# Patient Record
Sex: Female | Born: 1940 | Race: White | Hispanic: No | Marital: Single | Smoking: Former smoker
Health system: Southern US, Community
[De-identification: ages and names within clinical notes are randomized; demographics above are authoritative.]

## PROBLEM LIST (undated history)

## (undated) DIAGNOSIS — C50919 Malignant neoplasm of unspecified site of unspecified female breast: Secondary | ICD-10-CM

## (undated) DIAGNOSIS — R112 Nausea with vomiting, unspecified: Secondary | ICD-10-CM

## (undated) DIAGNOSIS — Z9889 Other specified postprocedural states: Secondary | ICD-10-CM

## (undated) DIAGNOSIS — I10 Essential (primary) hypertension: Secondary | ICD-10-CM

## (undated) DIAGNOSIS — E785 Hyperlipidemia, unspecified: Secondary | ICD-10-CM

## (undated) DIAGNOSIS — R42 Dizziness and giddiness: Secondary | ICD-10-CM

## (undated) DIAGNOSIS — T753XXA Motion sickness, initial encounter: Secondary | ICD-10-CM

## (undated) HISTORY — PX: BREAST CYST ASPIRATION: SHX578

## (undated) HISTORY — PX: PILONIDAL CYST / SINUS EXCISION: SUR543

## (undated) HISTORY — PX: ABDOMINAL HYSTERECTOMY: SHX81

## (undated) HISTORY — DX: Essential (primary) hypertension: I10

## (undated) HISTORY — DX: Dizziness and giddiness: R42

## (undated) HISTORY — DX: Hyperlipidemia, unspecified: E78.5

## (undated) HISTORY — PX: TOE SURGERY: SHX1073

## (undated) HISTORY — DX: Malignant neoplasm of unspecified site of unspecified female breast: C50.919

---

## 2008-12-03 ENCOUNTER — Ambulatory Visit: Payer: Self-pay | Admitting: Family Medicine

## 2010-04-23 ENCOUNTER — Ambulatory Visit: Payer: Self-pay | Admitting: Family Medicine

## 2011-08-03 ENCOUNTER — Ambulatory Visit: Payer: Self-pay | Admitting: Family Medicine

## 2012-09-11 ENCOUNTER — Emergency Department: Payer: Self-pay | Admitting: *Deleted

## 2012-09-11 LAB — COMPREHENSIVE METABOLIC PANEL
Alkaline Phosphatase: 72 U/L (ref 50–136)
Anion Gap: 9 (ref 7–16)
Calcium, Total: 9.7 mg/dL (ref 8.5–10.1)
Chloride: 104 mmol/L (ref 98–107)
Co2: 29 mmol/L (ref 21–32)
Creatinine: 0.8 mg/dL (ref 0.60–1.30)
Sodium: 142 mmol/L (ref 136–145)

## 2012-09-11 LAB — CBC
HCT: 41.5 % (ref 35.0–47.0)
MCHC: 34.3 g/dL (ref 32.0–36.0)
MCV: 92 fL (ref 80–100)
RDW: 14.1 % (ref 11.5–14.5)
WBC: 6.6 10*3/uL (ref 3.6–11.0)

## 2012-09-28 ENCOUNTER — Ambulatory Visit: Payer: Self-pay | Admitting: Family Medicine

## 2012-11-07 ENCOUNTER — Ambulatory Visit: Payer: Self-pay | Admitting: Family Medicine

## 2013-10-04 ENCOUNTER — Ambulatory Visit: Payer: Self-pay | Admitting: Family Medicine

## 2013-11-09 ENCOUNTER — Ambulatory Visit: Payer: Self-pay | Admitting: Family Medicine

## 2015-04-30 DIAGNOSIS — R42 Dizziness and giddiness: Secondary | ICD-10-CM | POA: Insufficient documentation

## 2015-04-30 DIAGNOSIS — H8190 Unspecified disorder of vestibular function, unspecified ear: Secondary | ICD-10-CM | POA: Insufficient documentation

## 2015-04-30 DIAGNOSIS — H832X9 Labyrinthine dysfunction, unspecified ear: Secondary | ICD-10-CM | POA: Insufficient documentation

## 2016-04-01 DIAGNOSIS — E785 Hyperlipidemia, unspecified: Secondary | ICD-10-CM | POA: Insufficient documentation

## 2016-04-19 ENCOUNTER — Other Ambulatory Visit: Payer: Self-pay | Admitting: Family Medicine

## 2016-04-19 DIAGNOSIS — Z780 Asymptomatic menopausal state: Secondary | ICD-10-CM

## 2016-04-19 DIAGNOSIS — Z1231 Encounter for screening mammogram for malignant neoplasm of breast: Secondary | ICD-10-CM

## 2016-05-03 ENCOUNTER — Ambulatory Visit
Admission: RE | Admit: 2016-05-03 | Discharge: 2016-05-03 | Disposition: A | Discharge status: Home / Self Care | Payer: Medicare Other | Source: Ambulatory Visit | Attending: Family Medicine | Admitting: Family Medicine

## 2016-05-03 DIAGNOSIS — M85832 Other specified disorders of bone density and structure, left forearm: Secondary | ICD-10-CM | POA: Insufficient documentation

## 2016-05-03 DIAGNOSIS — Z780 Asymptomatic menopausal state: Secondary | ICD-10-CM | POA: Diagnosis present

## 2016-05-03 DIAGNOSIS — Z1231 Encounter for screening mammogram for malignant neoplasm of breast: Secondary | ICD-10-CM | POA: Diagnosis not present

## 2016-05-05 ENCOUNTER — Other Ambulatory Visit: Payer: Self-pay | Admitting: Family Medicine

## 2016-05-05 DIAGNOSIS — R928 Other abnormal and inconclusive findings on diagnostic imaging of breast: Secondary | ICD-10-CM

## 2016-05-14 ENCOUNTER — Ambulatory Visit
Admission: RE | Admit: 2016-05-14 | Discharge: 2016-05-14 | Disposition: A | Discharge status: Home / Self Care | Payer: Medicare Other | Source: Ambulatory Visit | Attending: Family Medicine | Admitting: Family Medicine

## 2016-05-14 DIAGNOSIS — N63 Unspecified lump in breast: Secondary | ICD-10-CM | POA: Insufficient documentation

## 2016-05-14 DIAGNOSIS — R928 Other abnormal and inconclusive findings on diagnostic imaging of breast: Secondary | ICD-10-CM

## 2016-05-24 ENCOUNTER — Other Ambulatory Visit: Payer: Self-pay | Admitting: Family Medicine

## 2016-05-24 DIAGNOSIS — N63 Unspecified lump in unspecified breast: Secondary | ICD-10-CM

## 2016-05-25 ENCOUNTER — Other Ambulatory Visit: Payer: Self-pay | Admitting: Family Medicine

## 2016-05-25 DIAGNOSIS — N63 Unspecified lump in unspecified breast: Secondary | ICD-10-CM

## 2016-06-09 ENCOUNTER — Encounter: Payer: Self-pay | Admitting: Oncology

## 2016-06-09 ENCOUNTER — Ambulatory Visit
Admission: RE | Admit: 2016-06-09 | Discharge: 2016-06-09 | Disposition: A | Discharge status: Home / Self Care | Payer: Medicare Other | Source: Ambulatory Visit | Attending: Family Medicine | Admitting: Family Medicine

## 2016-06-09 DIAGNOSIS — N63 Unspecified lump in unspecified breast: Secondary | ICD-10-CM

## 2016-06-09 DIAGNOSIS — C50912 Malignant neoplasm of unspecified site of left female breast: Secondary | ICD-10-CM | POA: Insufficient documentation

## 2016-06-09 HISTORY — PX: BREAST BIOPSY: SHX20

## 2016-06-14 ENCOUNTER — Encounter: Payer: Self-pay | Admitting: *Deleted

## 2016-06-14 NOTE — Progress Notes (Signed)
  Oncology Nurse Navigator Documentation  Navigator Location: [facility] (06/14/16 1100) Navigator Encounter Type: Introductory phone call (06/14/16 1100)   Abnormal Finding Date: 05/14/16 (06/14/16 1100) Confirmed Diagnosis Date: 06/11/16 (06/14/16 1100)         Barriers/Navigation Needs: Coordination of Care;Education (06/14/16 1100) Education: Coping with Diagnosis/ Prognosis (06/14/16 1100) Interventions: Coordination of Care (06/14/16 1100)                      Time Spent with Patient: 45 (06/14/16 1100)   Called and talked with patient today to establish navigation services.  I have scheduled her to see Dr. Grayland Ormond on Friday, 06/18/16 at 8:30 at the [facility] office, and then Dr. Azalee Course for surgical consult on Friday at 12:45.  Will give patient educational literature at that time.  She is to call if she has any questions or needs.

## 2016-06-18 ENCOUNTER — Ambulatory Visit: Payer: 59 | Admitting: Oncology

## 2016-06-18 ENCOUNTER — Encounter: Payer: Self-pay | Admitting: *Deleted

## 2016-06-18 ENCOUNTER — Ambulatory Visit (INDEPENDENT_AMBULATORY_CARE_PROVIDER_SITE_OTHER): Payer: Medicare Other | Admitting: Surgery

## 2016-06-18 ENCOUNTER — Inpatient Hospital Stay: Payer: Medicare Other | Attending: Oncology | Admitting: Oncology

## 2016-06-18 ENCOUNTER — Other Ambulatory Visit: Payer: Self-pay | Admitting: Surgery

## 2016-06-18 ENCOUNTER — Telehealth: Payer: Self-pay

## 2016-06-18 ENCOUNTER — Other Ambulatory Visit: Payer: Self-pay

## 2016-06-18 ENCOUNTER — Encounter: Payer: Self-pay | Admitting: Oncology

## 2016-06-18 ENCOUNTER — Encounter: Payer: Self-pay | Admitting: Surgery

## 2016-06-18 VITALS — BP 110/72 | HR 60 | Temp 97.5°F | Resp 18 | Wt 176.6 lb

## 2016-06-18 VITALS — BP 138/83 | HR 62 | Temp 97.5°F | Ht 67.0 in | Wt 177.0 lb

## 2016-06-18 DIAGNOSIS — C50912 Malignant neoplasm of unspecified site of left female breast: Secondary | ICD-10-CM

## 2016-06-18 DIAGNOSIS — C50919 Malignant neoplasm of unspecified site of unspecified female breast: Secondary | ICD-10-CM

## 2016-06-18 DIAGNOSIS — C50512 Malignant neoplasm of lower-outer quadrant of left female breast: Secondary | ICD-10-CM | POA: Insufficient documentation

## 2016-06-18 DIAGNOSIS — Z170 Estrogen receptor positive status [ER+]: Secondary | ICD-10-CM | POA: Diagnosis not present

## 2016-06-18 DIAGNOSIS — Z809 Family history of malignant neoplasm, unspecified: Secondary | ICD-10-CM | POA: Diagnosis not present

## 2016-06-18 DIAGNOSIS — Z87891 Personal history of nicotine dependence: Secondary | ICD-10-CM | POA: Diagnosis not present

## 2016-06-18 NOTE — Progress Notes (Signed)
States is having soreness in left breast around biopsy site.

## 2016-06-18 NOTE — Telephone Encounter (Signed)
Patient notified of appointments listed below.  [facility]-06/28/16 @ 2pm  Dr.Loflin 06/30/16 @ 9:15 [facility] office.

## 2016-06-18 NOTE — Patient Instructions (Signed)
I will call you later today with your MRI appointment time and date and your follow up appointment with Dr.Loflin.

## 2016-06-18 NOTE — Progress Notes (Addendum)
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  INTERVAL HISTORY: Patient is a 75 year old female who had an abnormal yearly screening mammogram. Subsequent ultrasound and biopsy confirmed invasive adenocarcinoma. She currently has soreness in her left breast at her biopsy site, but otherwise feels well. She has no neurologic complaints. She denies any recent fevers or illnesses. She denies any chest pain or shortness of breath.  She has a good appetite and denies weight loss. She denies any nausea, vomiting, constipation, or diarrhea. She has no urinary complaints. Patient otherwise feels well and offers no further specific complaints.  REVIEW OF SYSTEMS:   Review of Systems  Constitutional: Negative.  Negative for fever, weight loss and malaise/fatigue.  Respiratory: Negative.  Negative for cough and shortness of breath.   Cardiovascular: Negative.  Negative for chest pain.  Gastrointestinal: Negative.  Negative for abdominal pain.  Genitourinary: Negative.   Musculoskeletal: Negative.   Neurological: Negative.  Negative for weakness.  Psychiatric/Behavioral: Negative.     As per HPI. Otherwise, a complete review of systems is negatve.  PAST MEDICAL HISTORY: Past Medical History  Diagnosis Date  . Breast cancer ([place])     PAST SURGICAL HISTORY: Past Surgical History  Procedure Laterality Date  . Breast cyst aspiration Left     neg  . Breast biopsy Left 06/09/2016    left [place] core bx  . Abdominal hysterectomy    . Toe surgery    . Pilonidal cyst / sinus excision      FAMILY HISTORY Family History  Problem Relation Age of Onset  . Cancer Mother        ADVANCED DIRECTIVES:      HEALTH MAINTENANCE: Social History  Substance Use Topics  . Smoking status: Former Research scientist (life sciences)  . Smokeless tobacco: Not on file  . Alcohol Use: Not on file     Colonoscopy:  PAP:  Bone density:  Lipid panel:  Not on File  Current Outpatient Prescriptions  Medication Sig Dispense Refill  . Multiple Vitamin (MULTIVITAMIN) capsule Take 1 capsule by mouth.    . triamterene-hydrochlorothiazide (DYAZIDE) 37.5-25 MG capsule Take 1 capsule by mouth.     No current facility-administered medications for this visit.    OBJECTIVE: Filed Vitals:   06/18/16 0903  BP: 110/72  Pulse: 60  Temp: 97.5 F (36.4 C)  Resp: 18     There is no height on file to calculate BMI.    ECOG FS:0 - Asymptomatic  General: Well-developed, well-nourished, no acute distress. Eyes: Pink conjunctiva, anicteric sclera. Breasts: Patient requested exam be deferred today. HEENT: Normocephalic, moist mucous membranes, clear oropharnyx. Lungs: Clear to auscultation bilaterally. Heart: Regular rate and rhythm. No rubs, murmurs, or gallops. Abdomen: Soft, nontender, nondistended. No organomegaly noted, normoactive bowel sounds. Musculoskeletal: No edema, cyanosis, or clubbing. Neuro: Alert, answering all questions appropriately. Cranial nerves grossly intact. Skin: No rashes or petechiae noted. Psych: Normal affect. Lymphatics: No cervical, calvicular, axillary or inguinal LAD.   LAB RESULTS:  Lab Results  Component Value Date   NA 142 09/11/2012   K 3.8 09/11/2012   CL 104 09/11/2012   CO2 29 09/11/2012   GLUCOSE 94 09/11/2012   BUN 27* 09/11/2012   CREATININE 0.80 09/11/2012   CALCIUM  9.7 09/11/2012   PROT 7.5 09/11/2012   ALBUMIN 4.4 09/11/2012   AST 19 09/11/2012   ALT 19 09/11/2012   ALKPHOS 72 09/11/2012   BILITOT 0.4 09/11/2012   GFRNONAA >60 09/11/2012   GFRAA >60 09/11/2012    Lab Results  Component Value Date   WBC 6.6 09/11/2012   HGB 14.2 09/11/2012   HCT 41.5 09/11/2012    MCV 92 09/11/2012   PLT 243 09/11/2012     STUDIES: Mm Digital Diagnostic Unilat L  06/09/2016  CLINICAL DATA:  Status post ultrasound-guided biopsy of a left breast mass earlier today. EXAM: DIAGNOSTIC LEFT MAMMOGRAM POST ULTRASOUND BIOPSY COMPARISON:  Previous exam(s). FINDINGS: Mammographic images were obtained following ultrasound guided biopsy of the left breast mass at the 3:30 o'clock axis, 8 cm from the nipple. At the conclusion of the procedure, a ribbon shaped tissue marker was placed at the biopsy site. This biopsy clip is well positioned within the targeted mass. IMPRESSION: Postprocedure mammogram for clip placement. Biopsy clip is well positioned within the targeted mass in the outer left breast. Final Assessment: Post Procedure Mammograms for Marker Placement Electronically Signed   By: Franki Cabot M.D.   On: 06/09/2016 09:17   [place] Lt Breast Bx W Loc Dev 1st Lesion Img Bx Spec US Guide  06/16/2016  ADDENDUM REPORT: 06/16/2016 08:53 ADDENDUM: Pathology of the left breast biopsy revealed INVASIVE MAMMARY CARCINOMA OF NO SPECIAL TYPE. TUMOR SIZE IN THIS CORE SAMPLE: 5 MM. PRELIMINARY GRADE: 1 (NOTTINGHAM HISTOLOGIC GRADE). Note: ER, PR and HER2-neu immunohistochemistry is obtained and results will be issued in an addendum. HER2 FISH will be performed for equivocal results. Final histologic grade should be based on the excised tumor. Results were called to Lorriane Shire of [place] on 06/11/16 at 9:50 AM. This was found to be concordant by Dr. Enriqueta Shutter. Recommendations:  Surgical referral. At the patient's request, pathology and recommendations were relayed to the patient by Dr. Theda Sers on 06/11/16. The patient stated she did well following the biopsy with no bleeding or hematoma. All of her questions were answered. She was encouraged to call the [facility] with any further questions or concerns. A surgical consultation will be made  for the patient by the nurse navigators at [facility]. The patient will be contacted by the navigators with the information. The nurse navigators were contacted by Jetta Lout, RRA with results and recommendations. An appointment was made for the patient to see Dr. Grayland Ormond (oncology) on 06/18/16 at 8:30 AM and Dr. Azalee Course (surgeon) for 06/18/16 at 12:45 PM by Tanya Nones, RN, nurse navigator. The patient has been notified of the appointments. Addendum by Jetta Lout, RRA on 06/16/16. Electronically Signed   By: Franki Cabot M.D.   On: 06/16/2016 08:53  06/16/2016  CLINICAL DATA:  Patient with a left breast mass presents today for ultrasound-guided biopsy. EXAM: ULTRASOUND GUIDED LEFT BREAST CORE NEEDLE BIOPSY COMPARISON:  Previous exam(s). PROCEDURE: I met with the patient and we discussed the procedure of ultrasound-guided biopsy, including benefits and alternatives. We discussed the high likelihood of a successful procedure. We discussed the risks of the procedure including infection, bleeding, tissue injury, clip migration, and inadequate sampling. Informed written consent was given. The usual time-out protocol was performed immediately prior to the procedure. Using sterile technique and 1% Lidocaine as local anesthetic, under direct ultrasound visualization, a 12 gauge spring-loaded device was used to perform biopsy of the complex cystic and  solid mass within the outer left breast, 3:30 o'clock axis region, 8 cm from the nipple,using a lateral approach. At the conclusion of the procedure, a ribbon shaped tissue marker clip was deployed into the biopsy cavity. Follow-up 2-view mammogram was performed and dictated separately. IMPRESSION: Ultrasound-guided biopsy of left breast mass at the 3:30 o'clock axis. No apparent complications. Electronically Signed: By: Franki Cabot M.D. On: 06/09/2016 09:19    ASSESSMENT: Clinical stage IA ER PR positive, HER-2 negative adenocarcinoma of the  lower outer quadrant of left breast.  PLAN:    1. 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 Patient has an appointment with surgery later this afternoon. Will arrange radiation oncology follow-up postoperatively. Will also order Oncotype testing on patient's tumor to assess whether chemotherapy is necessary or not. Patient will return to clinic 1 to 2 weeks postoperatively to discuss her Oncotype score and additional treatment planning. Also, at the conclusion of all her treatments she will require 5 years of an aromatase inhibitor given the ER/PR status of her tumor.  Approximately 45 minutes was spent in discussion of which greater than 50% was consultation.  Patient expressed understanding and was in agreement with this plan. She also understands that She can call clinic at any time with any questions, concerns, or complaints.     Lloyd Huger, MD   06/18/2016 4:43 PM

## 2016-06-18 NOTE — Progress Notes (Signed)
  Oncology Nurse Navigator Documentation  Navigator Location: [facility] (06/18/16 1000) Navigator Encounter Type: Initial MedOnc (06/18/16 1000)           Patient Visit Type: MedOnc (06/18/16 1000) Treatment Phase: Pre-Tx/Tx Discussion (06/18/16 1000) Barriers/Navigation Needs: Education (06/18/16 1000) Education: Newly Diagnosed Cancer Education;Understanding Cancer/ Treatment Options (06/18/16 1000) Interventions: Education Method (06/18/16 1000)     Education Method: Written (06/18/16 1000)      Acuity: Level 2 (06/18/16 1000)         Time Spent with Patient: 90 (06/18/16 1000)   Met patient today during her initial medical oncology visit with Dr. Grayland Ormond.  Patient is newly diagnosed with stage I invasive breast cancer.  Gave patient breast cancer educational literature, "My Breast Cancer Treatment Handbook" by Josephine Igo, RN.   Patient has an appointment today with Dr. Azalee Course.  She is to call me with her surgery date and I will schedule a follow-up appointment with Dr. Grayland Ormond 1-2 weeks after surgery.  She is to call if she has any questions or needs.

## 2016-06-21 ENCOUNTER — Encounter: Payer: Self-pay | Admitting: Surgery

## 2016-06-21 NOTE — Progress Notes (Signed)
Subjective:     Patient ID: Melanie Simmons, female   DOB: 12-25-1940, 75 y.o.   MRN: [id number]  HPI  75 yr old female who comes in with new diagnosis of left breast cancer, invasive mammory carcinoma, ER + and PR+.  Patient states that this was seen on her normal screening mammogram in she was called back for biopsy. Patient states that she does still have some soreness and bruising at the biopsy site but otherwise no changes. 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 Patient states that she has not noticed any other changes to her breast skin or lesions or any nipple discharge. Patient started menarche at the age of 56 and she had a hysterectomy in 1986 with removal of bilateral ovaries. She did undergo some hormone replacement therapy for about 20 years but then took herself off of them.  She had her first child at the age of 56 and she did use birth control for about 2 years prior to having her hysterectomy. She does not have any family history of breast cancer but her mother did have liver cancer.She otherwise has not had any further symptoms no fever chills malaise weight loss nausea vomiting abdominal pain diarrhea constipation or dysuria.    Past Medical History  Diagnosis Date  . Breast cancer ([place])   . Hyperlipidemia   . Vertigo    Past Surgical History  Procedure Laterality Date  . Abdominal hysterectomy    . Toe surgery    . Pilonidal cyst / sinus excision    . Breast cyst aspiration Left     neg  . Breast biopsy Left 06/09/2016    left [place] core bx   Family History  Problem Relation Age of Onset  . Cancer Mother    Social History   Social History  . Marital Status: Single    Spouse Name: N/A  . Number of Children: N/A  . Years of Education: N/A   Social History Main Topics  . Smoking status: Former Research scientist (life sciences)  . Smokeless tobacco: Never Used  . Alcohol Use: 8.4 oz/week     0 Standard drinks or equivalent, 14 Shots of liquor per week  . Drug Use: No  . Sexual Activity: Yes    Birth Control/ Protection: Post-menopausal, Surgical   Other Topics Concern  . None   Social History Narrative    Current outpatient prescriptions:  Marland Kitchen  Multiple Vitamin (MULTIVITAMIN) capsule, Take 1 capsule by mouth., Disp: , Rfl:  .  triamterene-hydrochlorothiazide (DYAZIDE) 37.5-25 MG capsule, Take 1 capsule by mouth., Disp: , Rfl:  No Known Allergies   Review of Systems  Constitutional: Negative for fever, chills, activity change, appetite change, fatigue and unexpected weight change.  HENT: Negative for congestion, sneezing and sore throat.   Respiratory: Negative for cough, choking, chest tightness, shortness of breath and wheezing.   Cardiovascular: Negative for chest pain, palpitations and leg swelling.  Gastrointestinal: Negative for vomiting, abdominal pain, diarrhea, constipation, abdominal distention and anal bleeding.  Genitourinary: Negative for dysuria, hematuria and difficulty urinating.  Musculoskeletal: Negative for back pain, arthralgias and neck pain.  Skin: Negative for color change, pallor, rash and wound.  Neurological: Positive for dizziness. Negative for tremors and syncope.  Psychiatric/Behavioral: Negative for agitation. The patient is not nervous/anxious.   All other systems reviewed and are negative.      Filed Vitals:   06/18/16  1303  BP: 138/83  Pulse: 62  Temp: 97.5 F (36.4 C)    Objective:   Physical Exam  Constitutional: She is oriented to person, place, and time. She appears well-developed and well-nourished. No distress.  HENT:  Head: Normocephalic and atraumatic.  Right Ear: External ear normal.  Left Ear: External ear normal.  Nose: Nose normal.  Mouth/Throat: Oropharynx is clear and moist. No oropharyngeal exudate.  Eyes: Conjunctivae and EOM are normal. Pupils are equal, round, and reactive to light. No scleral icterus.   Neck: Normal range of motion. Neck supple. No tracheal deviation present.  Cardiovascular: Normal rate, regular rhythm and intact distal pulses.   Pulmonary/Chest: Effort normal. No respiratory distress. She has no wheezes. She exhibits no tenderness.  Abdominal: Soft. Bowel sounds are normal. She exhibits no distension. There is no tenderness. There is no guarding.  Musculoskeletal: Normal range of motion. She exhibits no edema or tenderness.  Neurological: She is alert and oriented to person, place, and time. No cranial nerve deficit.  Skin: Skin is warm and dry. No rash noted. No erythema. No pallor.  Psychiatric: She has a normal mood and affect. Her behavior is normal. Judgment and thought content normal.  Vitals reviewed.  Imaging: Targeted ultrasound of the left breast was performed demonstrating a cyst with an internal solid component/mass demonstrating increased vascularity at 3 o'clock 8 cm from nipple measuring 1.1 x 1 x 1.2 cm. This mass corresponds with mammography findings. No lymphadenopathy seen in the left axilla.  IMPRESSION: Suspicious left breast mass/intracystic mass.  RECOMMENDATION: Ultrasound-guided biopsy of the mass in the outer left breast is recommended. This will be scheduled for the patient.  I have discussed the findings and recommendations with the patient. Results were also provided in writing at the conclusion of the visit. If applicable, a reminder letter will be sent to the patient regarding the next appointment.  BI-RADS CATEGORY 4: Suspicious.   Pathology:  LEFT BREAST, 3:30, 8 CMFN; BIOPSY:  - INVASIVE MAMMARY CARCINOMA OF NO SPECIAL TYPE.  - TUMOR SIZE IN THIS CORE SAMPLE: 5 MM.  - PRELIMINARY GRADE: 1 (NOTTINGHAM HISTOLOGIC GRADE)    TUBULAR/GLANDULAR DIFFERENTIATION SCORE: 2    NUCLEAR PLEOMORPHISM SCORE: 2    MITOTIC RATE SCORE: 1  BREAST BIOMARKER TESTS  Test(s) Performed: Estrogen Receptor (ER)  Results: POSITIVE   Range: >90%  Test Type:   FDA cleared ([place])  Primary Antibody:  SP1  Progesterone Receptor (PgR)  Results: POSITIVE  Range: >90%  Test Type:   FDA cleared ([place])  Primary Antibody:  1E2  HER2 (ERBB2) by Immunohistochemistry (IHC)  Results: Negative (Score 1+)     Assessment:     75yrold female with left breast cancer    Plan:     I personally reviewed the patient's past medical history well-controlled hyperlipidemia but otherwise some vertigo, also [facility] in [facility] at [facility].  I also personally reviewed the patient's laboratory values and pathology as above noting invasive mammary carcinoma, ER/PR+, Her2 negative.   I personally reviewed the radiology images as well as the read that as above. And on my review the area in question seemed larger then the 1 cm area that they were noting in this 3:00 position. There does appear to be some area of scar tissue in the area but the are is about 3 cm on my measurement however not very clearly  defined on either the ultrasound or the mammographic images. This could be distorted by previous scar tissue from cyst aspiration in 2013 or could represent that there is a potential larger area of either cancerous or DCIS material surrounding the biopsy area. I discussed this with the patient and showed her the images as well. I recommended that she have a MRI of the breasts for [place] to further evaluate the size of this area. I discussed with the patient the options of lumpectomy versus mastectomy but did let her know if this area is 3 cm in size would be too large given the relative size of her breast to do a lumpectomy and then would be recommending mastectomy.    I discussed the available options with the patient and daughter. The risk of recurrence is similar between mastectomy and lumpectomy with radiation. I also discussed that we  would need to do a sentinel lymph node biopsy to check the nodes. Explained to the patient that after her surgical treatment she would also need an aromatase inhibitor since her tumor was ER/PR +.   I discussed risks of bleeding, infection, damage to surrounding tissues, having positive margins, needing further resection, damage to nerves causing arm numbness or difficulty raising arm, causing lymphoedema in the arm; as well as anesthesia risks of MI, stroke, prolonged ventilation, pulmonary embolism, thrombosis and even death. Given that we still need to get an MRI to determine which surgical options is best for the patient, we deferred breast exam until next visit.  I will order MRI for next week and see her as soon as possible after that to determine best surgical intervention.  Patient was given the opportunity to ask questions and have them answered.  She is agreement with plan as stated.

## 2016-06-28 ENCOUNTER — Ambulatory Visit
Admission: RE | Admit: 2016-06-28 | Discharge: 2016-06-28 | Disposition: A | Discharge status: Home / Self Care | Payer: Medicare Other | Source: Ambulatory Visit | Attending: Surgery | Admitting: Surgery

## 2016-06-28 DIAGNOSIS — C50919 Malignant neoplasm of unspecified site of unspecified female breast: Secondary | ICD-10-CM

## 2016-06-28 MED ORDER — GADOBENATE DIMEGLUMINE 529 MG/ML IV SOLN
14.0000 mL | Freq: Once | INTRAVENOUS | Status: AC | PRN
  Administered 2016-06-28: 14 mL via INTRAVENOUS

## 2016-06-30 ENCOUNTER — Encounter: Payer: Self-pay | Admitting: Surgery

## 2016-06-30 ENCOUNTER — Ambulatory Visit: Payer: Self-pay | Admitting: Surgery

## 2016-06-30 ENCOUNTER — Ambulatory Visit (INDEPENDENT_AMBULATORY_CARE_PROVIDER_SITE_OTHER): Payer: Medicare Other | Admitting: Surgery

## 2016-06-30 VITALS — BP 153/84 | HR 65 | Temp 98.0°F | Ht 67.0 in | Wt 178.4 lb

## 2016-06-30 DIAGNOSIS — C50212 Malignant neoplasm of upper-inner quadrant of left female breast: Secondary | ICD-10-CM

## 2016-06-30 NOTE — Patient Instructions (Signed)
We have spoken to you today about 2 options to remove your Breast Cancer. Take some time to think about this decision. Call me when you are ready to schedule your surgery and I will be glad to answer and any questions that you have and assist you in scheduling your surgery.  Lumpectomy A lumpectomy is a form of "breast conserving" or "breast preservation" surgery. It may also be referred to as a partial mastectomy. During a lumpectomy, the portion of the breast that contains the cancerous tumor or breast mass (the lump) is removed. Some normal tissue around the lump may also be removed to make sure all of the tumor has been removed.  LET [facility] CARE PROVIDER KNOW ABOUT:  Any allergies you have.  All medicines you are taking, including vitamins, herbs, eye drops, creams, and over-the-counter medicines.  Previous problems you or members of your family have had with the use of anesthetics.  Any blood disorders you have.  Previous surgeries you have had.  Medical conditions you have. RISKS AND COMPLICATIONS Generally, this is a safe procedure. However, problems can occur and include:  Bleeding.  Infection.  Pain.  Temporary swelling.  Change in the shape of the breast, particularly if a large portion is removed. BEFORE THE PROCEDURE  Ask your health care provider about changing or stopping your regular medicines. This is especially important if you are taking diabetes medicines or blood thinners.  Do not eat or drink anything after midnight on the night before the procedure or as directed by your health care provider. Ask your health care provider if you can take a sip of water with any approved medicines.  On the day of surgery, your health care provider will use a mammogram or ultrasound to locate and mark the tumor in your breast. These markings on your breast will show where the cut (incision) will be made. PROCEDURE   An IV tube will be put into one of your veins.  You  may be given medicine to help you relax before the surgery (sedative). You will be given one of the following:  A medicine that numbs the area (local anesthetic).  A medicine that makes you fall asleep (general anesthetic).  Your health care provider will use a kind of electric scalpel that uses heat to minimize bleeding (electrocautery knife).  A curved incision (like a smile or frown) that follows the natural curve of your breast is made, to allow for minimal scarring and better healing.  The tumor will be removed with some of the surrounding tissue. This will be sent to the lab for analysis. Your health care provider may also remove your lymph nodes at this time if needed.  Sometimes, but not always, a rubber tube called a drain will be surgically inserted into your breast area or armpit to collect excess fluid that may accumulate in the space where the tumor was. This drain is connected to a plastic bulb on the outside of your body. This drain creates suction to help remove the fluid.  The incisions will be closed with stitches (sutures).  A bandage may be placed over the incisions. AFTER THE PROCEDURE  You will be taken to the recovery area.  You will be given medicine for pain.  A small rubber drain may be placed in the breast for 2-3 days to prevent a collection of blood (hematoma) from developing in the breast. You will be given instructions on caring for the drain before you go home.  A pressure bandage (dressing) will be applied for 1-2 days to prevent bleeding. Ask your health care provider how to care for your bandage at home.   This information is not intended to replace advice given to you by your health care provider. Make sure you discuss any questions you have with your health care provider.   Document Released: 01/17/2007 Document Revised: 12/27/2014 Document Reviewed: 05/11/2013 Elsevier Interactive Patient Education 2016 [place].   Total Mastectomy A total  mastectomy and a modified radical mastectomy are types of surgery for breast cancer. If you are having a total mastectomy (simple mastectomy), your entire breast will be removed. If you are having a modified radical mastectomy, your breast and nipple will be removed along with the lymph nodes under your arm. You may also have some of the lining over the muscle tissues under your breast removed. LET [facility] CARE PROVIDER KNOW ABOUT:  Any allergies you have.  All medicines you are taking, including vitamins, herbs, eye drops, creams, and over-the-counter medicines.  Previous problems you or members of your family have had with the use of anesthetics.  Any blood disorders you have.  Previous surgeries you have had.  Medical conditions you have. RISKS AND COMPLICATIONS Generally, this is a safe procedure. However, problems may occur, including:  Pain.  Infection.  Bleeding.  Scar tissue.  Chest numbness on the side of the surgery.  Fluid buildup under the skin flaps where your breast was removed (seroma).  Sensation of throbbing or tingling.  Stress or sadness from losing your breast. If you have the lymph nodes under your arm removed, you may have arm swelling, weakness, or numbness on the same side of your body as your surgery. BEFORE THE PROCEDURE  Ask your health care provider about:  Changing or stopping your regular medicines. This is especially important if you are taking diabetes medicines or blood thinners.  Taking medicines such as aspirin and ibuprofen. These medicines can thin your blood. Do not take these medicines before your procedure if your health care provider instructs you not to.  Follow your health care provider's instructions about eating or drinking restrictions.  Plan to have someone take you home after the procedure. PROCEDURE  An IV tube will be inserted into one of your veins.  You will be given a medicine that makes you fall asleep (general  anesthetic).  Your breast will be cleaned with a germ-killing solution (antiseptic).  A wide incision will be made around your nipple. The skin and nipple inside the incision will be removed along with all breast tissue.  If you are having a modified radical mastectomy:  The lining over your chest muscles will be removed.  The incision may be extended to reach the lymph nodes under your arm, or a second incision may be made.  The lymph nodes will be removed.  You may have a drainage tube inserted into your incision to collect fluid that builds up after surgery. This tube is connected to a suction bulb.  Your incision or incisions will be closed with stitches (sutures).  A bandage (dressing) will be placed over your breast and under your arm. The procedure may vary among health care providers and hospitals. AFTER THE PROCEDURE  You will be moved to a recovery area.  Your blood pressure, heart rate, breathing rate, and blood oxygen level will be monitored often until the medicines you were given have worn off.  You will be given pain medicine as needed.  After a while, you will be taken to a hospital room.  You will be encouraged to get up and walk as soon as you can.  Your IV tube can be removed when you are able to eat and drink.  Your drain may be removed before you go home from the hospital, or you may be sent home with your drain and suction bulb.   This information is not intended to replace advice given to you by your health care provider. Make sure you discuss any questions you have with your health care provider.   Document Released: 08/31/2001 Document Revised: 12/27/2014 Document Reviewed: 08/21/2014 Elsevier Interactive Patient Education [employer].

## 2016-07-01 ENCOUNTER — Encounter: Payer: Self-pay | Admitting: Surgery

## 2016-07-01 NOTE — Progress Notes (Addendum)
Subjective:     Patient ID: Melanie Simmons, female   DOB: Mar 22, 1941, 75 y.o.   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  Had her sent for a MRI of the breast to determine the size of suspicious area. Also discussed the patient in breast cancer conference with the multidisciplinary team.  Patient has not noted any other changes. Patient did bring her daughter in today and wanting to know more information about the 2 separate operations that could be performed. I did inform her that the size of the area on the MRI was about 2.5 cm and would make her eligible for a lumpectomy but would require radiation for 6 weeks afterwards. The patient is unsure and is leaning more toward mastectomy at this time but would like more time to make a decision. Past Medical History  Diagnosis Date  . Breast cancer ([place])   . Hyperlipidemia   . Vertigo    Past Surgical History  Procedure Laterality Date  . Abdominal hysterectomy    . Toe surgery    . Pilonidal cyst / sinus excision    . Breast cyst aspiration Left     neg  . Breast biopsy Left 06/09/2016    left [place] core bx   Family History  Problem Relation Age of Onset  . Cancer Mother 47    Liver   Social History   Social History  . Marital Status: Single    Spouse Name: N/A  . Number of Children: N/A  . Years of Education: N/A   Social History Main Topics  . Smoking status: Never Smoker   . Smokeless tobacco: Never Used  . Alcohol Use: 0.0 oz/week    0 Standard drinks or equivalent per week     Comment: 1 Scotch Daily  . Drug Use: No  . Sexual Activity: Yes    Birth Control/ Protection: Post-menopausal, Surgical   Other Topics Concern  . None   Social History Narrative    Current outpatient prescriptions:  Marland Kitchen  Multiple Vitamin (MULTIVITAMIN) capsule, Take 1 capsule by mouth., Disp: , Rfl:  .  triamterene-hydrochlorothiazide (DYAZIDE) 37.5-25 MG capsule, Take 1 capsule by mouth., Disp: ,  Rfl:  No Known Allergies    Review of Systems  Constitutional: Negative for fever, chills, appetite change, fatigue and unexpected weight change.  HENT: Negative for congestion, sinus pressure and sore throat.   Respiratory: Negative for cough, choking, shortness of breath, wheezing and stridor.   Cardiovascular: Negative for chest pain, palpitations and leg swelling.  Gastrointestinal: Negative for nausea, vomiting, abdominal pain, blood in stool, abdominal distention and anal bleeding.  Genitourinary: Negative for dysuria and urgency.  Musculoskeletal: Negative for back pain and neck pain.  Skin: Negative for color change, pallor, rash and wound.  Neurological: Negative for dizziness and weakness.  Hematological: Negative for adenopathy. Does not bruise/bleed easily.  Psychiatric/Behavioral: Negative for agitation. The patient is not nervous/anxious.   All other systems reviewed and are negative.      Filed Vitals:   06/30/16 0928  BP: 153/84  Pulse: 65  Temp: 98 F (36.7 C)    Objective:   Physical Exam  Constitutional: She is oriented to person, place, and time. She appears well-developed and well-nourished. No distress.  HENT:  Head: Normocephalic and atraumatic.  Right Ear: External ear normal.  Left Ear: External ear normal.  Nose: Nose normal.  Mouth/Throat: Oropharynx is clear and moist. No oropharyngeal  exudate.  Eyes: Conjunctivae and EOM are normal. Pupils are equal, round, and reactive to light. No scleral icterus.  Neck: Normal range of motion. Neck supple. No tracheal deviation present.  Cardiovascular: Normal rate, regular rhythm, normal heart sounds and intact distal pulses.  Exam reveals no gallop and no friction rub.   No murmur heard. Pulmonary/Chest: Effort normal and breath sounds normal. No respiratory distress. She has no wheezes. She has no rales.  Abdominal: Soft. Bowel sounds are normal. She exhibits no distension. There is no tenderness. There is  no rebound.  Musculoskeletal: Normal range of motion. She exhibits no edema or tenderness.  Neurological: She is alert and oriented to person, place, and time. No cranial nerve deficit.  Skin: Skin is warm and dry. No rash noted. No erythema. No pallor.  Psychiatric: She has a normal mood and affect. Her behavior is normal. Judgment and thought content normal.  Vitals reviewed. Breast Exam:  Right breast: no skin changes, nipple discharge or retraction, no masses, no lymphadenophthy Left breast: mild bruising from lateral biopsy site, no other skin changes, nipple discharge or retraction, no palpable mass, no axillary or supraclavicular lymphadenopathy  MRI:  IMPRESSION: Solitary 1.0 cm biopsy-proven malignancy in the lower outer quadrant of the left breast, posterior third. Negative for lymphadenopathy. No MRI evidence of malignancy in the right breast I also spoke with the radiologist on the phone and they measured the area in question at 2.5cm x 2.0cm     Assessment:     75 yr old female with left breast cancer ER/PR positive     Plan:     I discussed the available options with the patient and daughter. The risk of recurrence is similar between mastectomy and lumpectomy with radiation. I also discussed that we would need to do a sentinel lymph node biopsy to check the nodes with either procedure.I explained to the patient that after her surgical treatment she would also need an aromatase inhibitor since her tumor was ER/PR +.   I discussed risks of bleeding, infection, damage to surrounding tissues, having positive margins, needing further resection, damage to nerves causing arm numbness or difficulty raising arm, causing lymphoedema in the arm; as well as anesthesia risks of MI, stroke, prolonged ventilation, pulmonary embolism, thrombosis and even death. I also discussed that if a mastectomy was the choice shw would likely have drains in after the surgery and need to stay at least  one night in the hospital for pain control.  Patient and daughter were given the opportunity to ask questions and have them answered. She Is requesting more time to decide which procedure she would like to like to discuss it with her family. Patient is to call and let us know what she has decided and we will schedule her surgery at that time.

## 2016-07-08 ENCOUNTER — Encounter
Admission: RE | Admit: 2016-07-08 | Discharge: 2016-07-08 | Disposition: A | Discharge status: Home / Self Care | Payer: Medicare Other | Source: Ambulatory Visit | Attending: Surgery | Admitting: Surgery

## 2016-07-08 ENCOUNTER — Ambulatory Visit
Admission: RE | Admit: 2016-07-08 | Discharge: 2016-07-08 | Disposition: A | Discharge status: Home / Self Care | Payer: Medicare Other | Source: Ambulatory Visit | Attending: Surgery | Admitting: Surgery

## 2016-07-08 ENCOUNTER — Telehealth: Payer: Self-pay | Admitting: Surgery

## 2016-07-08 ENCOUNTER — Other Ambulatory Visit: Payer: Self-pay

## 2016-07-08 DIAGNOSIS — Z01810 Encounter for preprocedural cardiovascular examination: Secondary | ICD-10-CM | POA: Insufficient documentation

## 2016-07-08 DIAGNOSIS — Z01811 Encounter for preprocedural respiratory examination: Secondary | ICD-10-CM

## 2016-07-08 DIAGNOSIS — C50912 Malignant neoplasm of unspecified site of left female breast: Secondary | ICD-10-CM

## 2016-07-08 DIAGNOSIS — C50919 Malignant neoplasm of unspecified site of unspecified female breast: Secondary | ICD-10-CM | POA: Diagnosis present

## 2016-07-08 HISTORY — DX: Nausea with vomiting, unspecified: R11.2

## 2016-07-08 HISTORY — DX: Other specified postprocedural states: Z98.890

## 2016-07-08 LAB — CBC WITH DIFFERENTIAL/PLATELET
BASOS ABS: 0.1 10*3/uL (ref 0–0.1)
Basophils Relative: 1 %
EOS ABS: 0.1 10*3/uL (ref 0–0.7)
EOS PCT: 2 %
HCT: 41.1 % (ref 35.0–47.0)
Hemoglobin: 14.3 g/dL (ref 12.0–16.0)
LYMPHS PCT: 22 %
Lymphs Abs: 1.4 10*3/uL (ref 1.0–3.6)
MCH: 31.6 pg (ref 26.0–34.0)
MCHC: 34.7 g/dL (ref 32.0–36.0)
MCV: 91.1 fL (ref 80.0–100.0)
MONO ABS: 0.6 10*3/uL (ref 0.2–0.9)
Monocytes Relative: 9 %
Neutro Abs: 4.3 10*3/uL (ref 1.4–6.5)
Neutrophils Relative %: 66 %
PLATELETS: 249 10*3/uL (ref 150–440)
RBC: 4.52 MIL/uL (ref 3.80–5.20)
RDW: 13.5 % (ref 11.5–14.5)
WBC: 6.4 10*3/uL (ref 3.6–11.0)

## 2016-07-08 LAB — BASIC METABOLIC PANEL
ANION GAP: 7 (ref 5–15)
BUN: 32 mg/dL — AB (ref 6–20)
CALCIUM: 10.2 mg/dL (ref 8.9–10.3)
CO2: 33 mmol/L — ABNORMAL HIGH (ref 22–32)
CREATININE: 0.83 mg/dL (ref 0.44–1.00)
Chloride: 100 mmol/L — ABNORMAL LOW (ref 101–111)
GFR calc Af Amer: >60 mL/min (ref >60)
GLUCOSE: 100 mg/dL — AB (ref 65–99)
Potassium: 3.6 mmol/L (ref 3.5–5.1)
Sodium: 140 mmol/L (ref 135–145)

## 2016-07-08 LAB — PROTIME-INR
INR: 1.04
PROTHROMBIN TIME: 13.8 s (ref 11.4–15.0)

## 2016-07-08 LAB — APTT: APTT: 27 s (ref 24–36)

## 2016-07-08 NOTE — Pre-Procedure Instructions (Signed)
Reviewed EKG with Dr Rosey Bath, no further orders.

## 2016-07-08 NOTE — Patient Instructions (Signed)
Your procedure is scheduled on: Monday 07/12/16 Report to RADIOLOGY AT 8 AM AS DIRECTED   Remember: Instructions that are not followed completely may result in serious medical risk, up to and including death, or upon the discretion of your surgeon and anesthesiologist your surgery may need to be rescheduled.    __X__ 1. Do not eat food or drink liquids after midnight. No gum chewing or hard candies.     __X__ 2. No Alcohol for 24 hours before or after surgery.   ____ 3. Bring all medications with you on the day of surgery if instructed.    __X__ 4. Notify your doctor if there is any change in your medical condition     (cold, fever, infections).     Do not wear jewelry, make-up, hairpins, clips or nail polish.  Do not wear lotions, powders, or perfumes.   Do not shave 48 hours prior to surgery. Men may shave face and neck.  Do not bring valuables to the hospital.    [facility] is not responsible for any belongings or valuables.               Contacts, dentures or bridgework may not be worn into surgery.  Leave your suitcase in the car. After surgery it may be brought to your room.  For patients admitted to the hospital, discharge time is determined by your                treatment team.   Patients discharged the day of surgery will not be allowed to drive home.   Please read over the following fact sheets that you were given:   Surgical Site Infection Prevention   ____ Take these medicines the morning of surgery with A SIP OF WATER:    1. NONE  2.   3.   4.  5.  6.  ____ Fleet Enema (as directed)   __X__ Use CHG Soap as directed  ____ Use inhalers on the day of surgery  ____ Stop metformin 2 days prior to surgery    ____ Take 1/2 of usual insulin dose the night before surgery and none on the morning of surgery.   ____ Stop Coumadin/Plavix/aspirin on   ____ Stop Anti-inflammatories on    ____ Stop supplements until after surgery.    ____ Bring C-Pap to the  hospital.

## 2016-07-08 NOTE — Telephone Encounter (Signed)
Pt advised of pre op date/time and sx date. Sx: 07/12/16 with Dr Carole Binning Total Mastectomy with SN injection.  Pre op: 07/08/16 @ 1:00 pm--office.   Patient advised to arrive at registration in the [place] @ [facility] at 8:00 am the day of surgery so that she can go to radiology first for SN injection. Patient verbalized understanding of dates and times given.

## 2016-07-12 ENCOUNTER — Ambulatory Visit
Admission: RE | Admit: 2016-07-12 | Discharge: 2016-07-12 | Disposition: A | Discharge status: Home / Self Care | Payer: Medicare Other | Source: Ambulatory Visit | Attending: Surgery | Admitting: Surgery

## 2016-07-12 ENCOUNTER — Telehealth: Payer: Self-pay | Admitting: Surgery

## 2016-07-12 DIAGNOSIS — C50912 Malignant neoplasm of unspecified site of left female breast: Secondary | ICD-10-CM

## 2016-07-12 MED ORDER — CEFAZOLIN SODIUM-DEXTROSE 2-4 GM/100ML-% IV SOLN: 2.0000 g | INTRAVENOUS | Status: AC

## 2016-07-12 MED ORDER — CHLORHEXIDINE GLUCONATE CLOTH 2 % EX PADS: 6.0000 | MEDICATED_PAD | Freq: Once | CUTANEOUS | Status: DC

## 2016-07-12 MED ORDER — LACTATED RINGERS IV SOLN
INTRAVENOUS | Status: DC
  Administered 2016-07-16: 09:00:00 via INTRAVENOUS

## 2016-07-12 MED ORDER — FAMOTIDINE 20 MG PO TABS
20.0000 mg | ORAL_TABLET | Freq: Once | ORAL | Status: AC
  Administered 2016-07-16: 20 mg via ORAL

## 2016-07-12 NOTE — Telephone Encounter (Signed)
Patient has been advised that her surgery was reschedule from 7/24 to 7/28 due to the Humidity Levels within the hospital.   Pt advised of pre op date/time and sx date. Sx: 07/16/16 with Dr Carole Binning total mastectomy with SN injection.    Patient advised to arrive at 8:00am on the day of surgery at the [facility]Registration and will then be sent to [place] for SN injection.

## 2016-07-13 LAB — SURGICAL PATHOLOGY

## 2016-07-15 ENCOUNTER — Other Ambulatory Visit: Payer: Self-pay

## 2016-07-15 DIAGNOSIS — C50212 Malignant neoplasm of upper-inner quadrant of left female breast: Secondary | ICD-10-CM

## 2016-07-16 ENCOUNTER — Ambulatory Visit: Payer: Medicare Other | Admitting: Certified Registered"

## 2016-07-16 ENCOUNTER — Observation Stay
Admission: RE | Admit: 2016-07-16 | Discharge: 2016-07-17 | Disposition: A | Discharge status: Home / Self Care | Payer: Medicare Other | Source: Ambulatory Visit | Attending: Surgery | Admitting: Surgery

## 2016-07-16 ENCOUNTER — Ambulatory Visit
Admission: RE | Admit: 2016-07-16 | Discharge: 2016-07-16 | Disposition: A | Discharge status: Home / Self Care | Payer: Medicare Other | Source: Ambulatory Visit | Attending: Surgery | Admitting: Surgery

## 2016-07-16 ENCOUNTER — Encounter
Admission: RE | Disposition: A | Discharge status: Home / Self Care | Payer: Self-pay | Source: Ambulatory Visit | Attending: Surgery

## 2016-07-16 DIAGNOSIS — C50412 Malignant neoplasm of upper-outer quadrant of left female breast: Principal | ICD-10-CM | POA: Insufficient documentation

## 2016-07-16 DIAGNOSIS — Z79899 Other long term (current) drug therapy: Secondary | ICD-10-CM | POA: Diagnosis not present

## 2016-07-16 DIAGNOSIS — R42 Dizziness and giddiness: Secondary | ICD-10-CM | POA: Diagnosis not present

## 2016-07-16 DIAGNOSIS — E785 Hyperlipidemia, unspecified: Secondary | ICD-10-CM | POA: Diagnosis not present

## 2016-07-16 DIAGNOSIS — Z800 Family history of malignant neoplasm of digestive organs: Secondary | ICD-10-CM | POA: Insufficient documentation

## 2016-07-16 DIAGNOSIS — Z90710 Acquired absence of both cervix and uterus: Secondary | ICD-10-CM | POA: Insufficient documentation

## 2016-07-16 DIAGNOSIS — Z87891 Personal history of nicotine dependence: Secondary | ICD-10-CM | POA: Diagnosis not present

## 2016-07-16 DIAGNOSIS — C50512 Malignant neoplasm of lower-outer quadrant of left female breast: Secondary | ICD-10-CM | POA: Diagnosis not present

## 2016-07-16 DIAGNOSIS — Z170 Estrogen receptor positive status [ER+]: Secondary | ICD-10-CM | POA: Diagnosis not present

## 2016-07-16 DIAGNOSIS — C50912 Malignant neoplasm of unspecified site of left female breast: Secondary | ICD-10-CM

## 2016-07-16 HISTORY — PX: MASTECTOMY W/ SENTINEL NODE BIOPSY: SHX2001

## 2016-07-16 SURGERY — MASTECTOMY WITH SENTINEL LYMPH NODE BIOPSY
Anesthesia: General | Site: Breast | Laterality: Left | Wound class: Clean

## 2016-07-16 MED ORDER — FENTANYL CITRATE (PF) 100 MCG/2ML IJ SOLN
25.0000 ug | INTRAMUSCULAR | Status: DC | PRN
  Administered 2016-07-16 (×2): 25 ug via INTRAVENOUS

## 2016-07-16 MED ORDER — ONDANSETRON 4 MG PO TBDP: 4.0000 mg | ORAL_TABLET | Freq: Four times a day (QID) | ORAL | Status: DC | PRN

## 2016-07-16 MED ORDER — SUCCINYLCHOLINE CHLORIDE 20 MG/ML IJ SOLN
INTRAMUSCULAR | Status: DC | PRN
  Administered 2016-07-16: 100 mg via INTRAVENOUS

## 2016-07-16 MED ORDER — KETOROLAC TROMETHAMINE 30 MG/ML IJ SOLN
INTRAMUSCULAR | Status: DC | PRN
  Administered 2016-07-16: 30 mg via INTRAVENOUS

## 2016-07-16 MED ORDER — METHYLENE BLUE 0.5 % INJ SOLN
INTRAVENOUS | Status: DC | PRN
  Administered 2016-07-16: 5 mL via SUBMUCOSAL

## 2016-07-16 MED ORDER — MORPHINE SULFATE (PF) 2 MG/ML IV SOLN: 1.0000 mg | INTRAVENOUS | Status: DC | PRN

## 2016-07-16 MED ORDER — ONDANSETRON HCL 4 MG/2ML IJ SOLN
INTRAMUSCULAR | Status: DC | PRN
  Administered 2016-07-16: 4 mg via INTRAVENOUS

## 2016-07-16 MED ORDER — DOCUSATE SODIUM 100 MG PO CAPS
100.0000 mg | ORAL_CAPSULE | Freq: Two times a day (BID) | ORAL | Status: DC
  Administered 2016-07-16 – 2016-07-17 (×2): 100 mg via ORAL
  Filled 2016-07-16 (×2): qty 1

## 2016-07-16 MED ORDER — LACTATED RINGERS IV SOLN
INTRAVENOUS | Status: DC | PRN
  Administered 2016-07-16 (×2): via INTRAVENOUS

## 2016-07-16 MED ORDER — DEXAMETHASONE SODIUM PHOSPHATE 10 MG/ML IJ SOLN
INTRAMUSCULAR | Status: DC | PRN
  Administered 2016-07-16: 5 mg via INTRAVENOUS

## 2016-07-16 MED ORDER — METHYLENE BLUE 0.5 % INJ SOLN
INTRAVENOUS | Status: AC
  Filled 2016-07-16: qty 10

## 2016-07-16 MED ORDER — PANTOPRAZOLE SODIUM 40 MG PO TBEC
40.0000 mg | DELAYED_RELEASE_TABLET | Freq: Every day | ORAL | Status: DC
Start: 2016-07-16 — End: 2016-07-17
  Administered 2016-07-16 – 2016-07-17 (×2): 40 mg via ORAL
  Filled 2016-07-16 (×2): qty 1

## 2016-07-16 MED ORDER — FENTANYL CITRATE (PF) 100 MCG/2ML IJ SOLN
INTRAMUSCULAR | Status: DC | PRN
  Administered 2016-07-16 (×3): 50 ug via INTRAVENOUS
  Administered 2016-07-16: 100 ug via INTRAVENOUS

## 2016-07-16 MED ORDER — DEXTROSE-NACL 5-0.9 % IV SOLN
INTRAVENOUS | Status: DC
  Administered 2016-07-16 – 2016-07-17 (×2): via INTRAVENOUS

## 2016-07-16 MED ORDER — ONDANSETRON HCL 4 MG/2ML IJ SOLN: 4.0000 mg | Freq: Once | INTRAMUSCULAR | Status: DC | PRN

## 2016-07-16 MED ORDER — ENOXAPARIN SODIUM 40 MG/0.4ML ~~LOC~~ SOLN
40.0000 mg | SUBCUTANEOUS | Status: DC
  Administered 2016-07-17: 40 mg via SUBCUTANEOUS
  Filled 2016-07-16: qty 0.4

## 2016-07-16 MED ORDER — HYDRALAZINE HCL 20 MG/ML IJ SOLN: 10.0000 mg | INTRAMUSCULAR | Status: DC | PRN

## 2016-07-16 MED ORDER — ACETAMINOPHEN 500 MG PO TABS
1000.0000 mg | ORAL_TABLET | Freq: Four times a day (QID) | ORAL | Status: DC
  Administered 2016-07-16 – 2016-07-17 (×3): 1000 mg via ORAL
  Filled 2016-07-16 (×3): qty 2

## 2016-07-16 MED ORDER — MIDAZOLAM HCL 2 MG/2ML IJ SOLN
INTRAMUSCULAR | Status: DC | PRN
  Administered 2016-07-16: 2 mg via INTRAVENOUS

## 2016-07-16 MED ORDER — DIPHENHYDRAMINE HCL 50 MG/ML IJ SOLN
INTRAMUSCULAR | Status: DC | PRN
  Administered 2016-07-16: 6.25 mg via INTRAVENOUS

## 2016-07-16 MED ORDER — LIDOCAINE HCL (CARDIAC) 20 MG/ML IV SOLN
INTRAVENOUS | Status: DC | PRN
  Administered 2016-07-16: 50 mg via INTRAVENOUS

## 2016-07-16 MED ORDER — TRIAMTERENE-HCTZ 37.5-25 MG PO TABS
1.0000 | ORAL_TABLET | Freq: Every day | ORAL | Status: DC
Start: 2016-07-16 — End: 2016-07-17
  Administered 2016-07-16 – 2016-07-17 (×2): 1 via ORAL
  Filled 2016-07-16 (×2): qty 1

## 2016-07-16 MED ORDER — CYCLOBENZAPRINE HCL 10 MG PO TABS
10.0000 mg | ORAL_TABLET | Freq: Three times a day (TID) | ORAL | Status: DC
  Administered 2016-07-16 – 2016-07-17 (×3): 10 mg via ORAL
  Filled 2016-07-16 (×3): qty 1

## 2016-07-16 MED ORDER — CEFAZOLIN SODIUM-DEXTROSE 2-4 GM/100ML-% IV SOLN
INTRAVENOUS | Status: AC
  Administered 2016-07-16: 2 g via INTRAVENOUS
  Filled 2016-07-16: qty 100

## 2016-07-16 MED ORDER — PROPOFOL 10 MG/ML IV BOLUS
INTRAVENOUS | Status: DC | PRN
  Administered 2016-07-16: 200 mg via INTRAVENOUS

## 2016-07-16 MED ORDER — FENTANYL CITRATE (PF) 100 MCG/2ML IJ SOLN
INTRAMUSCULAR | Status: AC
  Administered 2016-07-16: 14:00:00
  Filled 2016-07-16: qty 2

## 2016-07-16 MED ORDER — KETOROLAC TROMETHAMINE 30 MG/ML IJ SOLN
15.0000 mg | Freq: Four times a day (QID) | INTRAMUSCULAR | Status: DC
  Administered 2016-07-16 – 2016-07-17 (×4): 15 mg via INTRAVENOUS
  Filled 2016-07-16 (×3): qty 1

## 2016-07-16 MED ORDER — DEXTROSE 5 % IV SOLN: 2000.0000 mg | Freq: Once | INTRAVENOUS | Status: DC

## 2016-07-16 MED ORDER — TECHNETIUM TC 99M SULFUR COLLOID
1.1000 | Freq: Once | INTRAVENOUS | Status: AC | PRN
  Administered 2016-07-16: 1.1 via INTRAVENOUS

## 2016-07-16 MED ORDER — OXYCODONE HCL 5 MG PO TABS: 5.0000 mg | ORAL_TABLET | ORAL | Status: DC | PRN

## 2016-07-16 MED ORDER — FAMOTIDINE 20 MG PO TABS
ORAL_TABLET | ORAL | Status: AC
  Administered 2016-07-16: 10:00:00
  Filled 2016-07-16: qty 1

## 2016-07-16 MED ORDER — ONDANSETRON HCL 4 MG/2ML IJ SOLN: 4.0000 mg | Freq: Four times a day (QID) | INTRAMUSCULAR | Status: DC | PRN

## 2016-07-16 MED ORDER — CEFAZOLIN SODIUM-DEXTROSE 2-4 GM/100ML-% IV SOLN
2.0000 g | Freq: Once | INTRAVENOUS | Status: AC
  Administered 2016-07-16: 2 g via INTRAVENOUS

## 2016-07-16 SURGICAL SUPPLY — 33 items
BULB RESERV EVAC DRAIN JP 100C (MISCELLANEOUS) ×6 IMPLANT
CANISTER SUCT 1200ML W/VALVE (MISCELLANEOUS) ×3 IMPLANT
CHLORAPREP W/TINT 26ML (MISCELLANEOUS) ×3 IMPLANT
CNTNR SPEC 2.5X3XGRAD LEK (MISCELLANEOUS) ×4
CONT SPEC 4OZ STER OR WHT (MISCELLANEOUS) ×8
CONTAINER SPEC 2.5X3XGRAD LEK (MISCELLANEOUS) ×4 IMPLANT
DRAIN CHANNEL JP 19F (MISCELLANEOUS) ×3 IMPLANT
DRAPE LAPAROTOMY TRNSV 106X77 (MISCELLANEOUS) ×3 IMPLANT
ELECT CAUTERY BLADE 6.4 (BLADE) ×3 IMPLANT
ELECT REM PT RETURN 9FT ADLT (ELECTROSURGICAL) ×3
ELECTRODE REM PT RTRN 9FT ADLT (ELECTROSURGICAL) ×1 IMPLANT
GAUZE FLUFF 18X24 1PLY STRL (GAUZE/BANDAGES/DRESSINGS) ×3 IMPLANT
GLOVE BIO SURGEON STRL SZ7 (GLOVE) ×3 IMPLANT
GLOVE PI ORTHOPRO 6.5 (GLOVE) ×4
GLOVE PI ORTHOPRO STRL 6.5 (GLOVE) ×2 IMPLANT
GOWN STRL REUS W/ TWL LRG LVL3 (GOWN DISPOSABLE) ×4 IMPLANT
GOWN STRL REUS W/TWL LRG LVL3 (GOWN DISPOSABLE) ×8
LIQUID BAND (GAUZE/BANDAGES/DRESSINGS) ×6 IMPLANT
NDL SAFETY 25GX1.5 (NEEDLE) ×3 IMPLANT
NEEDLE FILTER BLUNT 18X 1/2SAF (NEEDLE) ×2
NEEDLE FILTER BLUNT 18X1 1/2 (NEEDLE) ×1 IMPLANT
PACK BASIN MAJOR ARMC (MISCELLANEOUS) ×3 IMPLANT
SLEVE PROBE SENORX GAMMA FIND (MISCELLANEOUS) ×3 IMPLANT
SURGI-BRA LG (MISCELLANEOUS) ×3 IMPLANT
SUT ETHILON 3-0 FS-10 30 BLK (SUTURE) ×6
SUT MNCRL 3-0 UNDYED SH (SUTURE) ×3 IMPLANT
SUT MNCRL 4-0 (SUTURE) ×4
SUT MNCRL 4-0 27XMFL (SUTURE) ×2
SUT MONOCRYL 3-0 UNDYED (SUTURE) ×6
SUTURE EHLN 3-0 FS-10 30 BLK (SUTURE) ×2 IMPLANT
SUTURE MNCRL 4-0 27XMF (SUTURE) ×2 IMPLANT
SYRINGE 10CC LL (SYRINGE) ×3 IMPLANT
WATER STERILE IRR 1000ML POUR (IV SOLUTION) ×3 IMPLANT

## 2016-07-16 NOTE — Anesthesia Postprocedure Evaluation (Signed)
Anesthesia Post Note  Patient: Melanie Simmons  Procedure(s) Performed: Procedure(s) (LRB): MASTECTOMY WITH SENTINEL LYMPH NODE BIOPSY (Left)  Patient location during evaluation: PACU Anesthesia Type: General Level of consciousness: awake and alert and oriented Pain management: pain level controlled Vital Signs Assessment: post-procedure vital signs reviewed and stable Respiratory status: spontaneous breathing Cardiovascular status: blood pressure returned to baseline Anesthetic complications: no    Last Vitals:  Vitals:   07/16/16 1421 07/16/16 1446  BP: 139/69 (!) 124/59  Pulse: 73 71  Resp: 15   Temp: 36.7 C 36.4 C    Last Pain:  Vitals:   07/16/16 1446  TempSrc: Oral  PainSc:                  Laylana Gerwig

## 2016-07-16 NOTE — Transfer of Care (Signed)
Immediate Anesthesia Transfer of Care Note  Patient: Melanie Simmons  Procedure(s) Performed: Procedure(s): MASTECTOMY WITH SENTINEL LYMPH NODE BIOPSY (Left)  Patient Location: PACU  Anesthesia Type:General  Level of Consciousness: sedated and responds to stimulation  Airway & Oxygen Therapy: Patient Spontanous Breathing and Patient connected to face mask oxygen  Post-op Assessment: Report given to RN and Post -op Vital signs reviewed and stable  Post vital signs: Reviewed and stable  Last Vitals:  Vitals:   07/16/16 0854 07/16/16 1337  BP: (!) 141/74 140/71  Pulse: 63 88  Resp: 16   Temp: (!) 35.8 C     Last Pain:  Vitals:   07/16/16 0854  TempSrc: Tympanic         Complications: No apparent anesthesia complications

## 2016-07-16 NOTE — Op Note (Signed)
Left mastectomy with left axillary sentinel lymph node biopsy Procedure Note  Indications: This patient presents for surgical treatment of Breast Cancer.  Pre-operative Diagnosis: left breast cancer  Post-operative Diagnosis: left breast cancer  Surgeon: Hubbard Robinson   Assistants: none  Anesthesia: General endotracheal anesthesia  ASA Class: 2  Procedure Details  The patient was seen again in the Holding Room. The risks, benefits, indications, potential complications, treatment options, and expected outcomes were discussed with the patient. The possibilities of reaction to medication, pulmonary aspiration, bleeding, recurrent infection, the need for additional procedures, failure to diagnose a condition, and creating a complication requiring transfusion or further operation were discussed with the patient. The patient and/or family concurred with the proposed plan, giving informed consent. The site of surgery was properly noted/marked. The patient was taken to the Operating Room, identified as Melanie Simmons and the procedure verified as left mastectomy with left axillary sentinel lymph node biopsy. A Time Out was held and the above information confirmed.  The patient was placed supine and general anesthetic was administered.  Methylene blue dye 5cc was injected around the nipple areolar complex and massaged into the area for 5 mins.   The left arm, left breast, and chest were prepped and draped in standard fashion. An oblique elliptical incision was made encompassing the nipple. Skin flaps were created meticulously to preserve the subdermal blood supply and maintain a clear margin of resection around the tumor. Dissection was carried out to sternum, clavicle, inframammary fold and latissimus dorsi and then  down to the pectoralis fascia, which was included with the specimen and was submitted to pathology has left breast with markings.   Using a hand-held gamma probe, axillary sentinel  nodes were identified. Dissection was carried through the clavipectoral fascia.  Two axillary sentinel nodes were removed and submitted to pathology revealing both being blue and counting 681, and 652 on the gamma probe.    The specimen was submitted to pathology.   The wound was irrigated. One J-P drains were placed. The skin incision was closed in layers with a 3-0 Vicryl subcutaneous closure and a running 4-0 Monocryl for subcuticular closure. The drain was secured with 3-0 nylon sutures. Sterile glue was applied along the incision and allowed to dry.  A dry bulky gauze dressing and surgical bra were used as a dressing.   Instrument, sponge, and needle counts were correct at closure and at the conclusion of the case.   Findings: Two sentinel lymph nodes were hot and blue 1- 681 and 2-652  Estimated Blood Loss: less than 50 mL         Drains: one JP drain placed as above         Total IV Fluids: 1061mL         Specimens: Left Breast;  Left axillary lymph nodes #1 and #2   Complications: None; patient tolerated the procedure well.         Disposition: PACU - hemodynamically stable.         Condition: stable

## 2016-07-16 NOTE — Anesthesia Procedure Notes (Signed)
Procedure Name: Intubation Date/Time: 07/16/2016 11:10 AM Performed by: Timoteo Expose Pre-anesthesia Checklist: Patient identified, Emergency Drugs available, Suction available, Patient being monitored and Timeout performed Patient Re-evaluated:Patient Re-evaluated prior to inductionOxygen Delivery Method: Circle system utilized Preoxygenation: Pre-oxygenation with 100% oxygen Intubation Type: IV induction Ventilation: Mask ventilation without difficulty Laryngoscope Size: Mac and 3 Grade View: Grade II Tube type: Oral Tube size: 7.0 mm Number of attempts: 2 Airway Equipment and Method: Stylet Placement Confirmation: ETT inserted through vocal cords under direct vision,  positive ETCO2,  CO2 detector and breath sounds checked- equal and bilateral Secured at: 22 cm Tube secured with: Tape Dental Injury: Teeth and Oropharynx as per pre-operative assessment

## 2016-07-16 NOTE — H&P (View-Only) (Signed)
Subjective:     Patient ID: Melanie Simmons, female   DOB: Jul 23, 1941, 75 y.o.   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  Had her sent for a MRI of the breast to determine the size of suspicious area. Also discussed the patient in breast cancer conference with the multidisciplinary team.  Patient has not noted any other changes. Patient did bring her daughter in today and wanting to know more information about the 2 separate operations that could be performed. I did inform her that the size of the area on the MRI was about 2.5 cm and would make her eligible for a lumpectomy but would require radiation for 6 weeks afterwards. The patient is unsure and is leaning more toward mastectomy at this time but would like more time to make a decision. Past Medical History  Diagnosis Date  . Breast cancer ([place])   . Hyperlipidemia   . Vertigo    Past Surgical History  Procedure Laterality Date  . Abdominal hysterectomy    . Toe surgery    . Pilonidal cyst / sinus excision    . Breast cyst aspiration Left     neg  . Breast biopsy Left 06/09/2016    left [place] core bx   Family History  Problem Relation Age of Onset  . Cancer Mother 32    Liver   Social History   Social History  . Marital Status: Single    Spouse Name: N/A  . Number of Children: N/A  . Years of Education: N/A   Social History Main Topics  . Smoking status: Never Smoker   . Smokeless tobacco: Never Used  . Alcohol Use: 0.0 oz/week    0 Standard drinks or equivalent per week     Comment: 1 Scotch Daily  . Drug Use: No  . Sexual Activity: Yes    Birth Control/ Protection: Post-menopausal, Surgical   Other Topics Concern  . None   Social History Narrative    Current outpatient prescriptions:  Marland Kitchen  Multiple Vitamin (MULTIVITAMIN) capsule, Take 1 capsule by mouth., Disp: , Rfl:  .  triamterene-hydrochlorothiazide (DYAZIDE) 37.5-25 MG capsule, Take 1 capsule by mouth., Disp: ,  Rfl:  No Known Allergies    Review of Systems  Constitutional: Negative for fever, chills, appetite change, fatigue and unexpected weight change.  HENT: Negative for congestion, sinus pressure and sore throat.   Respiratory: Negative for cough, choking, shortness of breath, wheezing and stridor.   Cardiovascular: Negative for chest pain, palpitations and leg swelling.  Gastrointestinal: Negative for nausea, vomiting, abdominal pain, blood in stool, abdominal distention and anal bleeding.  Genitourinary: Negative for dysuria and urgency.  Musculoskeletal: Negative for back pain and neck pain.  Skin: Negative for color change, pallor, rash and wound.  Neurological: Negative for dizziness and weakness.  Hematological: Negative for adenopathy. Does not bruise/bleed easily.  Psychiatric/Behavioral: Negative for agitation. The patient is not nervous/anxious.   All other systems reviewed and are negative.      Filed Vitals:   06/30/16 0928  BP: 153/84  Pulse: 65  Temp: 98 F (36.7 C)    Objective:   Physical Exam  Constitutional: She is oriented to person, place, and time. She appears well-developed and well-nourished. No distress.  HENT:  Head: Normocephalic and atraumatic.  Right Ear: External ear normal.  Left Ear: External ear normal.  Nose: Nose normal.  Mouth/Throat: Oropharynx is clear and moist. No oropharyngeal  exudate.  Eyes: Conjunctivae and EOM are normal. Pupils are equal, round, and reactive to light. No scleral icterus.  Neck: Normal range of motion. Neck supple. No tracheal deviation present.  Cardiovascular: Normal rate, regular rhythm, normal heart sounds and intact distal pulses.  Exam reveals no gallop and no friction rub.   No murmur heard. Pulmonary/Chest: Effort normal and breath sounds normal. No respiratory distress. She has no wheezes. She has no rales.  Abdominal: Soft. Bowel sounds are normal. She exhibits no distension. There is no tenderness. There is  no rebound.  Musculoskeletal: Normal range of motion. She exhibits no edema or tenderness.  Neurological: She is alert and oriented to person, place, and time. No cranial nerve deficit.  Skin: Skin is warm and dry. No rash noted. No erythema. No pallor.  Psychiatric: She has a normal mood and affect. Her behavior is normal. Judgment and thought content normal.  Vitals reviewed. Breast Exam:  Right breast: no skin changes, nipple discharge or retraction, no masses, no lymphadenophthy Left breast: mild bruising from lateral biopsy site, no other skin changes, nipple discharge or retraction, no palpable mass, no axillary or supraclavicular lymphadenopathy  MRI:  IMPRESSION: Solitary 1.0 cm biopsy-proven malignancy in the lower outer quadrant of the left breast, posterior third. Negative for lymphadenopathy. No MRI evidence of malignancy in the right breast I also spoke with the radiologist on the phone and they measured the area in question at 2.5cm x 2.0cm     Assessment:     75 yr old female with left breast cancer ER/PR positive     Plan:     I discussed the available options with the patient and daughter. The risk of recurrence is similar between mastectomy and lumpectomy with radiation. I also discussed that we would need to do a sentinel lymph node biopsy to check the nodes with either procedure.I explained to the patient that after her surgical treatment she would also need an aromatase inhibitor since her tumor was ER/PR +.   I discussed risks of bleeding, infection, damage to surrounding tissues, having positive margins, needing further resection, damage to nerves causing arm numbness or difficulty raising arm, causing lymphoedema in the arm; as well as anesthesia risks of MI, stroke, prolonged ventilation, pulmonary embolism, thrombosis and even death. I also discussed that if a mastectomy was the choice shw would likely have drains in after the surgery and need to stay at least  one night in the hospital for pain control.  Patient and daughter were given the opportunity to ask questions and have them answered. She Is requesting more time to decide which procedure she would like to like to discuss it with her family. Patient is to call and let us know what she has decided and we will schedule her surgery at that time.

## 2016-07-16 NOTE — Interval H&P Note (Signed)
History and Physical Interval Note:  07/16/2016 10:27 AM  Melanie Simmons  has presented today for surgery, with the diagnosis of MALIGNANT NEOPLASM UPPER,INNER QUADRANT  The various methods of treatment have been discussed with the patient and family. After consideration of risks, benefits and other options for treatment, the patient has consented to  Procedure(s): MASTECTOMY WITH SENTINEL LYMPH NODE BIOPSY (Left) as a surgical intervention .  The patient's history has been reviewed, patient examined, no change in status, stable for surgery.  I have reviewed the patient's chart and labs.  Questions were answered to the patient's satisfaction.     Lovett Coffin L Mabel Unrein

## 2016-07-16 NOTE — Anesthesia Preprocedure Evaluation (Signed)
Anesthesia Evaluation  Patient identified by MRN, date of birth, ID band Patient awake    Reviewed: Allergy & Precautions, NPO status , Patient's Chart, lab work & pertinent test results  History of Anesthesia Complications (+) PONV and history of anesthetic complications  Airway Mallampati: II  TM Distance: >3 FB     Dental  (+) Caps   Pulmonary neg pulmonary ROS, former smoker,    Pulmonary exam normal        Cardiovascular negative cardio ROS Normal cardiovascular exam     Neuro/Psych Vertigo and inner ear dysfunction    GI/Hepatic Neg liver ROS,   Endo/Other  negative endocrine ROS  Renal/GU negative Renal ROS  negative genitourinary   Musculoskeletal negative musculoskeletal ROS (+)   Abdominal Normal abdominal exam  (+)   Peds negative pediatric ROS (+)  Hematology negative hematology ROS (+)   Anesthesia Other Findings   Reproductive/Obstetrics                             Anesthesia Physical Anesthesia Plan  ASA: II  Anesthesia Plan: General   Post-op Pain Management:    Induction: Intravenous  Airway Management Planned: Oral ETT  Additional Equipment:   Intra-op Plan:   Post-operative Plan: Extubation in OR  Informed Consent: I have reviewed the patients History and Physical, chart, labs and discussed the procedure including the risks, benefits and alternatives for the proposed anesthesia with the patient or authorized representative who has indicated his/her understanding and acceptance.   Dental advisory given  Plan Discussed with: CRNA and Surgeon  Anesthesia Plan Comments:         Anesthesia Quick Evaluation

## 2016-07-17 DIAGNOSIS — C50412 Malignant neoplasm of upper-outer quadrant of left female breast: Secondary | ICD-10-CM | POA: Diagnosis not present

## 2016-07-17 HISTORY — PX: MASTECTOMY: SHX3

## 2016-07-17 MED ORDER — IBUPROFEN 800 MG PO TABS: 800.0000 mg | ORAL_TABLET | Freq: Three times a day (TID) | ORAL | 0 refills | Status: DC | PRN

## 2016-07-17 MED ORDER — OXYCODONE-ACETAMINOPHEN 5-325 MG PO TABS: 1.0000 | ORAL_TABLET | ORAL | 0 refills | Status: DC | PRN

## 2016-07-17 MED ORDER — CYCLOBENZAPRINE HCL 10 MG PO TABS: 10.0000 mg | ORAL_TABLET | Freq: Three times a day (TID) | ORAL | 0 refills | Status: DC | PRN

## 2016-07-17 MED ORDER — OXYCODONE HCL 5 MG PO TABS
5.0000 mg | ORAL_TABLET | Freq: Four times a day (QID) | ORAL | 0 refills | Status: DC | PRN
Start: 2016-07-17 — End: 2016-07-17

## 2016-07-17 NOTE — Care Management Obs Status (Signed)
[facility] NOTIFICATION   Patient Details  Name: Melanie Simmons MRN: [id number] Date of Birth: 01-08-1941   Medicare Observation Status Notification Given:  Yes    Carles Collet, RN 07/17/2016, 9:01 AM

## 2016-07-17 NOTE — Discharge Summary (Signed)
Physician Discharge Summary  Patient ID: Melanie Simmons MRN: [id number] DOB/AGE: 09-09-1941 75 y.o.  Admit date: 07/16/2016 Discharge date: 07/17/2016  Admission Diagnoses: Left breast cancer  Discharge Diagnoses:  Active Problems:   Breast cancer of lower-outer quadrant of left female breast ([place])   Breast cancer of upper-outer quadrant of left female breast [facility])   Discharged Condition: good  Hospital Course: 75 yr old female with left breast cancer, underwent left mastectomy with two sentinel lymph node biopsies.  Patient doing well, states pain well controlled. She has been up and walking around and tolerating diet without issues.   Consults: None  Significant Diagnostic Studies:   Treatments: surgery: Left mastectomy with sentinel lymph node biopsy 7/28 Dr. Azalee Course  Discharge Exam: Blood pressure (!) 97/48, pulse 66, temperature 97.8 F (36.6 C), temperature source Oral, resp. rate 20, height 5\' 9"  (1.753 m), weight 175 lb (79.4 kg), SpO2 99 %. General appearance: alert, cooperative and no distress Chest wall: left mastectomy incision c/d/i, no erythema or drainage, slight ecchymosis along most medial portion, jp drain with serosanginous drainage Extremities: extremities normal, atraumatic, no cyanosis or edema  Disposition: Final discharge disposition not confirmed  Discharge Instructions    Call MD for:  persistant nausea and vomiting    Complete by:  As directed   Call MD for:  redness, tenderness, or signs of infection (pain, swelling, redness, odor or green/yellow discharge around incision site)    Complete by:  As directed   Call MD for:  severe uncontrolled pain    Complete by:  As directed   Call MD for:  temperature >100.4    Complete by:  As directed   Diet general    Complete by:  As directed   Driving Restrictions    Complete by:  As directed   No driving while on prescription pain medication   Increase activity slowly    Complete by:  As directed   Lifting  restrictions    Complete by:  As directed   No lifting over 15lbs for 4 weeks   May shower / Bathe    Complete by:  As directed   May walk up steps    Complete by:  As directed   No dressing needed    Complete by:  As directed       Medication List    TAKE these medications   cyclobenzaprine 10 MG tablet Commonly known as:  FLEXERIL Take 1 tablet (10 mg total) by mouth 3 (three) times daily as needed for muscle spasms.   ibuprofen 800 MG tablet Commonly known as:  ADVIL,MOTRIN Take 1 tablet (800 mg total) by mouth every 8 (eight) hours as needed.   multivitamin capsule Take 1 capsule by mouth daily.   oxyCODONE-acetaminophen 5-325 MG tablet Commonly known as:  ROXICET Take 1-2 tablets by mouth every 4 (four) hours as needed.   triamterene-hydrochlorothiazide 37.5-25 MG capsule Commonly known as:  DYAZIDE Take 1 capsule by mouth daily.      Follow-up Information    Hubbard Robinson, MD. Call today.   Specialty:  Surgery Why:  call the office on Monday 7/31 and get appointment for f/u drain removal later next week Contact information: [address] [place] [phone]           Signed: Hubbard Robinson 07/17/2016, 1:12 PM

## 2016-07-17 NOTE — Care Management Note (Addendum)
Case Management Note  Patient Details  Name: Melanie Simmons MRN: [id number] Date of Birth: 10/14/1941  Subjective/Objective:                 Patient from home with husband, independent, lives in [place]. Does not receive HH. In OBS for Brst CA. Denies difficulties obtaining meds.    Action/Plan:  Pt will DC to home today, no CM needs identified.   Expected Discharge Date:  07/18/16               Expected Discharge Plan:  Home/Self Care  In-House Referral:     Discharge planning Services  CM Consult  Post Acute Care Choice:  NA Choice offered to:  NA  DME Arranged:  N/A DME Agency:  NA  HH Arranged:  NA HH Agency:  NA  Status of Service:  In process, will continue to follow  If discussed at Long Length of Stay Meetings, dates discussed:    Additional Comments:  Carles Collet, RN 07/17/2016, 9:02 AM

## 2016-07-19 LAB — SURGICAL PATHOLOGY

## 2016-07-22 ENCOUNTER — Ambulatory Visit (INDEPENDENT_AMBULATORY_CARE_PROVIDER_SITE_OTHER): Payer: Medicare Other | Admitting: Surgery

## 2016-07-22 ENCOUNTER — Encounter: Payer: Self-pay | Admitting: Surgery

## 2016-07-22 VITALS — BP 130/79 | HR 72 | Temp 97.8°F | Ht 67.0 in | Wt 179.0 lb

## 2016-07-22 DIAGNOSIS — C50512 Malignant neoplasm of lower-outer quadrant of left female breast: Secondary | ICD-10-CM

## 2016-07-22 NOTE — Progress Notes (Signed)
75 yr old female with left breast cancer underwent left breast mastectomy with sentinel lymph node biopsy on June 28. The patient has been doing well states that she's had hardly any pain. Patient states that she's taken some ibuprofen but otherwise no other medicines. Patient did state that she had a little bit of drainage around the JP drain at home however that is stopped. Patient states she's had a little bit of a decreased appetite but that it's improving and been slightly tired but that's also improving. Patient overall is doing very well.  Vitals:   07/22/16 1438  BP: 130/79  Pulse: 72  Temp: 97.8 F (36.6 C)   PE:  Gen: NAD Left chest: masectomy incision site c/d/i, without erythema or drainage, JP drain removed today   A/P:  Patient doing well after left breast mastectomy. I went over the pathology report of ER/PR positive invasive mammary carcinoma 7 mm in size, with 2 lymph nodes negative. The patient was given a prosthesis prescription today. I also again discussed with the patient that she would need an aromatase inhibitor due to the ER/PR positivity and will have her follow-up with Dr. Grayland Ormond in the next week or so to start this. Also discussed with her that I will have her come see me in 6 months for a mastectomy site exam.

## 2016-07-22 NOTE — Patient Instructions (Signed)
We will call Dr. Gary Fleet office to schedule an appointment.  Then we will call you with an appointment for your mammogram and office visit with Dr. Azalee Course after you have your mammogram.

## 2016-08-05 NOTE — Progress Notes (Signed)
  Oncology Nurse Navigator Documentation  Navigator Location: [facility] (08/05/16 1500) Navigator Encounter Type: Telephone (08/05/16 1500) Telephone: Lahoma Crocker Call;Appt Confirmation/Clarification (08/05/16 1500)         Patient Visit Type: Post-op Appt (08/05/16 1500)       Interventions: Coordination of Care (08/05/16 1500)                      Time Spent with Patient: 15 (08/05/16 1500)  Patient to be scheduled for post mastectomy follow-up appointment with Dr. Grayland Ormond.  Per [facility]  RN, patient appointment may be scheduled on 08/09/16 at either 10:15 a.m. or 3:30 p.m.  Left message for patient to call to schedule appointment, then in-basket message will be sent to scheduler.         Oncology Nurse Navigator Documentation  Navigator Location: [facility] (08/05/16 1600) Navigator Encounter Type: Telephone (08/05/16 1600) Telephone: Incoming Call;Outgoing Call (08/05/16 1600)         Patient Visit Type: Post-op Appt (08/05/16 1600)       Interventions: Coordination of Care (08/05/16 1600)                      Time Spent with Patient: 30 (08/05/16 1600)  Patient phoned back. Prefers appointment in [place].  Coordinated appointment with [facility] for 08/13/16 at 10:15 at [facility].  Notified patient ,and scheduler.

## 2016-08-12 ENCOUNTER — Telehealth: Payer: Self-pay | Admitting: *Deleted

## 2016-08-12 NOTE — Progress Notes (Deleted)
[facility]  Telephone:([phone]) [phone] Fax:(336) [phone]  ID: Melanie Simmons OB: 1941/12/05  MR#: [id number]  HKF#:[id number]  Patient Care Team: Gayland Curry, MD as PCP - General (Family Medicine)  CHIEF COMPLAINT: Clinical stage IA ER PR positive, HER-2 negative adenocarcinoma of the lower outer quadrant of left breast.  INTERVAL HISTORY: Patient is a 75 year old female who had an abnormal yearly screening mammogram. Subsequent ultrasound and biopsy confirmed invasive adenocarcinoma. She currently has soreness in her left breast at her biopsy site, but otherwise feels well. She has no neurologic complaints. She denies any recent fevers or illnesses. She denies any chest pain or shortness of breath.  She has a good appetite and denies weight loss. She denies any nausea, vomiting, constipation, or diarrhea. She has no urinary complaints. Patient otherwise feels well and offers no further specific complaints.  REVIEW OF SYSTEMS:   Review of Systems  Constitutional: Negative.  Negative for fever, malaise/fatigue and weight loss.  Respiratory: Negative.  Negative for cough and shortness of breath.   Cardiovascular: Negative.  Negative for chest pain.  Gastrointestinal: Negative.  Negative for abdominal pain.  Genitourinary: Negative.   Musculoskeletal: Negative.   Neurological: Negative.  Negative for weakness.  Psychiatric/Behavioral: Negative.     As per HPI. Otherwise, a complete review of systems is negatve.  PAST MEDICAL HISTORY: Past Medical History:  Diagnosis Date  . Breast cancer ([place])   . Hyperlipidemia   . PONV (postoperative nausea and vomiting)   . Vertigo     PAST SURGICAL HISTORY: Past Surgical History:  Procedure Laterality Date  . ABDOMINAL HYSTERECTOMY    . BREAST BIOPSY Left 06/09/2016   left [place] core bx  . BREAST CYST ASPIRATION Left    neg  . MASTECTOMY W/ SENTINEL NODE BIOPSY Left 07/16/2016   Procedure: MASTECTOMY WITH SENTINEL  LYMPH NODE BIOPSY;  Surgeon: Hubbard Robinson, MD;  Location: [facility] ORS;  Service: General;  Laterality: Left;  . PILONIDAL CYST / SINUS EXCISION    . TOE SURGERY      FAMILY HISTORY Family History  Problem Relation Age of Onset  . Cancer Mother 35    Liver       ADVANCED DIRECTIVES:    HEALTH MAINTENANCE: Social History  Substance Use Topics  . Smoking status: Former Research scientist (life sciences)  . Smokeless tobacco: Never Used  . Alcohol use 0.0 oz/week     Comment: 1 Scotch Daily     Colonoscopy:  PAP:  Bone density:  Lipid panel:  No Known Allergies  Current Outpatient Prescriptions  Medication Sig Dispense Refill  . cyclobenzaprine (FLEXERIL) 10 MG tablet Take 1 tablet (10 mg total) by mouth 3 (three) times daily as needed for muscle spasms. 30 tablet 0  . ibuprofen (ADVIL,MOTRIN) 800 MG tablet Take 1 tablet (800 mg total) by mouth every 8 (eight) hours as needed. 30 tablet 0  . Multiple Vitamin (MULTIVITAMIN) capsule Take 1 capsule by mouth daily.     Marland Kitchen triamterene-hydrochlorothiazide (DYAZIDE) 37.5-25 MG capsule Take 1 capsule by mouth daily.      No current facility-administered medications for this visit.     OBJECTIVE: There were no vitals filed for this visit.   There is no height or weight on file to calculate BMI.    ECOG FS:0 - Asymptomatic  General: Well-developed, well-nourished, no acute distress. Eyes: Pink conjunctiva, anicteric sclera. Breasts: Patient requested exam be deferred today. HEENT: Normocephalic, moist mucous membranes, clear oropharnyx. Lungs: Clear to auscultation bilaterally. Heart: Regular  rate and rhythm. No rubs, murmurs, or gallops. Abdomen: Soft, nontender, nondistended. No organomegaly noted, normoactive bowel sounds. Musculoskeletal: No edema, cyanosis, or clubbing. Neuro: Alert, answering all questions appropriately. Cranial nerves grossly intact. Skin: No rashes or petechiae noted. Psych: Normal affect. Lymphatics: No cervical, calvicular,  axillary or inguinal LAD.   LAB RESULTS:  Lab Results  Component Value Date   NA 140 07/08/2016   K 3.6 07/08/2016   CL 100 (L) 07/08/2016   CO2 33 (H) 07/08/2016   GLUCOSE 100 (H) 07/08/2016   BUN 32 (H) 07/08/2016   CREATININE 0.83 07/08/2016   CALCIUM 10.2 07/08/2016   PROT 7.5 09/11/2012   ALBUMIN 4.4 09/11/2012   AST 19 09/11/2012   ALT 19 09/11/2012   ALKPHOS 72 09/11/2012   BILITOT 0.4 09/11/2012   GFRNONAA >60 07/08/2016   GFRAA >60 07/08/2016    Lab Results  Component Value Date   WBC 6.4 07/08/2016   NEUTROABS 4.3 07/08/2016   HGB 14.3 07/08/2016   HCT 41.1 07/08/2016   MCV 91.1 07/08/2016   PLT 249 07/08/2016     STUDIES: Nm Sentinel Node Injection  Result Date: 07/16/2016 CLINICAL DATA:  Left breast cancer. EXAM: NUCLEAR MEDICINE BREAST LYMPHOSCINTIGRAPHY TECHNIQUE: Skin was prepped with chlorhexidine. Skin was anesthetized with 1% lidocaine. Intradermal injection of radiopharmaceutical was performed at the 3 o'clock position around the left nipple. The patient was then sent to the operating room where the sentinel node(s) were identified and removed by the surgeon. RADIOPHARMACEUTICALS:  Total of 1.1 mCi Millipore-filtered Technetium-9msulfur colloid. IMPRESSION: Uncomplicated intradermal injection of Technetium-943mulfur colloid for purposes of sentinel node identification. Electronically Signed   By: AdMarkus Daft.D.   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  PLAN:    1. 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 Patient has an appointment with surgery later this afternoon. Will arrange radiation oncology follow-up postoperatively. Will also order Oncotype testing on patient's tumor to assess whether  chemotherapy is necessary or not. Patient will return to clinic 1 to 2 weeks postoperatively to discuss her Oncotype score and additional treatment planning. Also, at the conclusion of all her treatments she will require 5 years of an aromatase inhibitor given the ER/PR status of her tumor.  Approximately 45 minutes was spent in discussion of which greater than 50% was consultation.  Patient expressed understanding and was in agreement with this plan. She also understands that She can call clinic at any time with any questions, concerns, or complaints.     TiLloyd HugerMD   08/12/2016 12:12 AM

## 2016-08-12 NOTE — Telephone Encounter (Signed)
Left message for patient to return my call.  She has had her surgery and planned follow-up appointment with Dr. Grayland Ormond was for tomorrow.  Oncotype DX results are not available so patient can be rescheduled.

## 2016-08-13 ENCOUNTER — Telehealth: Payer: Self-pay | Admitting: *Deleted

## 2016-08-13 ENCOUNTER — Telehealth: Payer: Self-pay | Admitting: Surgery

## 2016-08-13 ENCOUNTER — Ambulatory Visit: Payer: Medicare Other | Admitting: Oncology

## 2016-08-13 NOTE — Telephone Encounter (Signed)
Returned phone call to patient at this time. She prefers to see another Materials engineer at [facility]. She was also requesting to bypass Oncology and have Dr. Azalee Course order Tamoxifen. I explained to patient that Oncology is who needs to order and follow her Tamoxifen. She agrees to seeing Oncology after this discussion.

## 2016-08-13 NOTE — Telephone Encounter (Signed)
Patient was scheduled for follow up with Dr. Grayland Ormond in [place] on 8/25. Scheduling called patient to move out appointment due to oncotype results not being back. Patient was upset with scheduling and did not understand the change in appointments. I spoke with patient to explain that Dr. Grayland Ormond would need results of oncotype dx testing prior to discussion of final treatment plan. Patient was upset because she states she was never told about need for oncotype testing or radiation treatment. I apologized for patient not knowing the next steps in treatment plan and explained oncotype testing and radiation consultation appointment. Patient states that she had decided on total mastectomy vs lumpectomy so radiation was not needed. I advised patient that I would discuss her treatment plan with Dr. Grayland Ormond since she had total mastectomy. Dr. Grayland Ormond states patient does not need radiation but he still needs oncotype testing results prior to her next appointment. Due to patient being so upset I discussed her plan of care with Darnelle Spangle contacted patient to discuss plan of care and future appointment planning. Oncotype test was sent on 8/24, results should be back in 10-14 days. Per Melanie Simmons patient does not currently desire follow up in the cancer center and will check with Dr. Azalee Course regarding AI and further treatment. Melanie Simmons to follow up with patient next week.

## 2016-08-13 NOTE — Telephone Encounter (Signed)
Melanie Donovan, RN, Dr. Gary Fleet nurse, had talked with the patient yesterday and stated she was upset with her plan of care.  Called patient today to review plan of care and discuss any concerns.  She was very upset, and did not feel like she had been told about the Oncotype Dx test, and thought she would not need any chemo if she had the mastectomy.  General education given on Oncotype Dx, mastectomy. ER/PR status and general standard of care.  She wanted to know if Dr. Azalee Course could order her the antihormonal therapy.  I did suggest she talk with Dr. Azalee Course about this.  She states she doesn't want to see Dr. Grayland Ormond again.  I told her I would be glad to assist arranging her an appointment to see another oncologist, but I really felt we should get the Oncotype DX test back first, because the next oncologist will need those results.  She has agreed to let me call her back next week.  She is to call if she has any questions or needs.

## 2016-08-13 NOTE — Telephone Encounter (Signed)
Patient had mastectomy with Dr Azalee Course on 07/16/16. She does not wish to see Dr Grayland Ormond and would like to be referred to another oncologist. She would like to speak with you about this. She also has a question about a medication (a pill she is supposed to take for the next 5 years) Please call at your convenience. Thank you.

## 2016-08-24 ENCOUNTER — Encounter: Payer: Self-pay | Admitting: *Deleted

## 2016-08-24 NOTE — Progress Notes (Signed)
  Oncology Nurse Navigator Documentation  Navigator Location: [facility] (08/24/16 1500) Navigator Encounter Type: Telephone (08/24/16 1500) Telephone: Outgoing Call (08/24/16 1500)         Patient Visit Type: Post-op Appt (08/24/16 1500)   Barriers/Navigation Needs: Education;Coordination of Care (08/24/16 1500) Education: Other (08/24/16 1500) Interventions: Coordination of Care (08/24/16 1500)   Coordination of Care: Appts (08/24/16 1500)                  Time Spent with Patient: 45 (08/24/16 1500)   Talked to patient today.  Informed her that we have her Oncotype Dx results back and her recurrence score is low.  Asked about following up with an Oncologist.  States Dr. Azalee Course would not prescribe the antihormonal, asked again why she needed it.  Reviewed role of antihormonal therapy with estrogen positive breast cancer.  She has agreed to see a different oncologist.  I have scheduled her to see Dr. Rogue Bussing on 09/02/16 @ 8:45.

## 2016-08-30 ENCOUNTER — Ambulatory Visit: Payer: Medicare Other | Admitting: Oncology

## 2016-09-01 ENCOUNTER — Ambulatory Visit: Payer: Medicare Other | Admitting: Oncology

## 2016-09-01 ENCOUNTER — Ambulatory Visit: Payer: 59 | Admitting: Internal Medicine

## 2016-09-01 ENCOUNTER — Institutional Professional Consult (permissible substitution): Payer: Medicare Other | Admitting: Radiation Oncology

## 2016-09-01 ENCOUNTER — Ambulatory Visit: Payer: 59 | Admitting: Oncology

## 2016-09-02 ENCOUNTER — Inpatient Hospital Stay: Payer: Medicare Other | Attending: Oncology | Admitting: Internal Medicine

## 2016-09-02 VITALS — BP 111/76 | HR 64 | Temp 97.7°F | Resp 18 | Ht 67.0 in | Wt 178.0 lb

## 2016-09-02 DIAGNOSIS — Z87891 Personal history of nicotine dependence: Secondary | ICD-10-CM | POA: Insufficient documentation

## 2016-09-02 DIAGNOSIS — Z170 Estrogen receptor positive status [ER+]: Secondary | ICD-10-CM | POA: Diagnosis not present

## 2016-09-02 DIAGNOSIS — Z800 Family history of malignant neoplasm of digestive organs: Secondary | ICD-10-CM

## 2016-09-02 DIAGNOSIS — R232 Flushing: Secondary | ICD-10-CM | POA: Diagnosis not present

## 2016-09-02 DIAGNOSIS — Z79899 Other long term (current) drug therapy: Secondary | ICD-10-CM | POA: Diagnosis not present

## 2016-09-02 DIAGNOSIS — Z9013 Acquired absence of bilateral breasts and nipples: Secondary | ICD-10-CM | POA: Diagnosis not present

## 2016-09-02 DIAGNOSIS — C50512 Malignant neoplasm of lower-outer quadrant of left female breast: Secondary | ICD-10-CM | POA: Diagnosis present

## 2016-09-02 DIAGNOSIS — R42 Dizziness and giddiness: Secondary | ICD-10-CM

## 2016-09-02 DIAGNOSIS — E785 Hyperlipidemia, unspecified: Secondary | ICD-10-CM | POA: Insufficient documentation

## 2016-09-02 MED ORDER — ANASTROZOLE 1 MG PO TABS: 1.0000 mg | ORAL_TABLET | Freq: Every day | ORAL | 3 refills | Status: DC

## 2016-09-02 NOTE — Progress Notes (Signed)
[place] OFFICE PROGRESS NOTE  Patient Care Team: Gayland Curry, MD as PCP - General (Family Medicine)  Breast cancer of lower-outer quadrant of left female breast [facility])   Staging form: Breast, AJCC 7th Edition   - Clinical stage from 06/18/2016: Stage IA (T1c, N0, M0) - Signed by Lloyd Huger, MD on 06/18/2016   Oncology History   # AUG 2017- RIGHT BREAST CA- ER/PRPOS; Her 2 Neu NEG; STAGE IA ;s/p Mastec & SLNBx [Dr.Loflin] ONCOTYPE- LOW [RS=0; dist recur risk 3%]; START Arimidex  # BMD- 2017- Normal     Breast cancer of lower-outer quadrant of left female breast ([place])   06/18/2016 Initial Diagnosis    Breast cancer of lower-outer quadrant of left female breast ([place])       INTERVAL HISTORY:  Melanie Simmons 75 y.o.  female patient above history of Newly diagnosed breast cancer based on a screening mammogram- decided to have mastectomy. Is here to discuss further treatment options.  Has chronic hot flashes. She stopped taking her hormone replacement therapy with a recent diagnosis of breast cancer.  Patient is healing well from surgery. Denies any infection issues. Denies any unusual aches and pains.  REVIEW OF SYSTEMS:  A complete 10 point review of system is done which is negative except mentioned above/history of present illness.   PAST MEDICAL HISTORY :  Past Medical History:  Diagnosis Date  . Breast cancer ([place])   . Hyperlipidemia   . PONV (postoperative nausea and vomiting)   . Vertigo     PAST SURGICAL HISTORY :   Past Surgical History:  Procedure Laterality Date  . ABDOMINAL HYSTERECTOMY    . BREAST BIOPSY Left 06/09/2016   left [place] core bx  . BREAST CYST ASPIRATION Left    neg  . MASTECTOMY W/ SENTINEL NODE BIOPSY Left 07/16/2016   Procedure: MASTECTOMY WITH SENTINEL LYMPH NODE BIOPSY;  Surgeon: Hubbard Robinson, MD;  Location: [facility] ORS;  Service: General;  Laterality: Left;  . PILONIDAL CYST / SINUS EXCISION    . TOE SURGERY       FAMILY HISTORY :   Family History  Problem Relation Age of Onset  . Cancer Mother 52    Liver    SOCIAL HISTORY:  Lives in [place].  Social History  Substance Use Topics  . Smoking status: Former Research scientist (life sciences)  . Smokeless tobacco: Never Used  . Alcohol use 0.0 oz/week     Comment: 1 Scotch Daily    ALLERGIES:  has No Known Allergies.  MEDICATIONS:  Current Outpatient Prescriptions  Medication Sig Dispense Refill  . Calcium 600-200 MG-UNIT tablet Take 1 tablet by mouth 2 (two) times daily.    Marland Kitchen triamterene-hydrochlorothiazide (DYAZIDE) 37.5-25 MG capsule Take 1 capsule by mouth daily.     Marland Kitchen anastrozole (ARIMIDEX) 1 MG tablet Take 1 tablet (1 mg total) by mouth daily. 90 tablet 3  . Multiple Vitamin (MULTIVITAMIN) capsule Take 1 capsule by mouth daily.      No current facility-administered medications for this visit.     PHYSICAL EXAMINATION: ECOG PERFORMANCE STATUS: 0 - Asymptomatic  BP 111/76 (BP Location: Right Arm, Patient Position: Sitting)   Pulse 64   Temp 97.7 F (36.5 C) (Tympanic)   Resp 18   Ht 5' 7"  (1.702 m)   Wt 178 lb (80.7 kg)   BMI 27.88 kg/m   Filed Weights   09/02/16 0852  Weight: 178 lb (80.7 kg)    GENERAL: Well-nourished well-developed; Alert, no  distress and comfortable.   Alone EYES: no pallor or icterus OROPHARYNX: no thrush or ulceration; good dentition  NECK: supple, no masses felt LYMPH:  no palpable lymphadenopathy in the cervical, axillary or inguinal regions LUNGS: clear to auscultation and  No wheeze or crackles HEART/CVS: regular rate & rhythm and no murmurs; No lower extremity edema ABDOMEN:abdomen soft, non-tender and normal bowel sounds Musculoskeletal:no cyanosis of digits and no clubbing  PSYCH: alert & oriented x 3 with fluent speech NEURO: no focal motor/sensory deficits SKIN:  no rashes or significant lesions  LABORATORY DATA:  I have reviewed the data as listed    Component Value Date/Time   NA 140 07/08/2016 1353    NA 142 09/11/2012 2242   K 3.6 07/08/2016 1353   K 3.8 09/11/2012 2242   CL 100 (L) 07/08/2016 1353   CL 104 09/11/2012 2242   CO2 33 (H) 07/08/2016 1353   CO2 29 09/11/2012 2242   GLUCOSE 100 (H) 07/08/2016 1353   GLUCOSE 94 09/11/2012 2242   BUN 32 (H) 07/08/2016 1353   BUN 27 (H) 09/11/2012 2242   CREATININE 0.83 07/08/2016 1353   CREATININE 0.80 09/11/2012 2242   CALCIUM 10.2 07/08/2016 1353   CALCIUM 9.7 09/11/2012 2242   PROT 7.5 09/11/2012 2242   ALBUMIN 4.4 09/11/2012 2242   AST 19 09/11/2012 2242   ALT 19 09/11/2012 2242   ALKPHOS 72 09/11/2012 2242   BILITOT 0.4 09/11/2012 2242   GFRNONAA >60 07/08/2016 1353   GFRNONAA >60 09/11/2012 2242   GFRAA >60 07/08/2016 1353   GFRAA >60 09/11/2012 2242    No results found for: SPEP, UPEP  Lab Results  Component Value Date   WBC 6.4 07/08/2016   NEUTROABS 4.3 07/08/2016   HGB 14.3 07/08/2016   HCT 41.1 07/08/2016   MCV 91.1 07/08/2016   PLT 249 07/08/2016      Chemistry      Component Value Date/Time   NA 140 07/08/2016 1353   NA 142 09/11/2012 2242   K 3.6 07/08/2016 1353   K 3.8 09/11/2012 2242   CL 100 (L) 07/08/2016 1353   CL 104 09/11/2012 2242   CO2 33 (H) 07/08/2016 1353   CO2 29 09/11/2012 2242   BUN 32 (H) 07/08/2016 1353   BUN 27 (H) 09/11/2012 2242   CREATININE 0.83 07/08/2016 1353   CREATININE 0.80 09/11/2012 2242      Component Value Date/Time   CALCIUM 10.2 07/08/2016 1353   CALCIUM 9.7 09/11/2012 2242   ALKPHOS 72 09/11/2012 2242   AST 19 09/11/2012 2242   ALT 19 09/11/2012 2242   BILITOT 0.4 09/11/2012 2242       RADIOGRAPHIC STUDIES: I have personally reviewed the radiological images as listed and agreed with the findings in the report. No results found.   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 Status post mastectomy. No role for any radiation.  # Low risk Oncotype-  distant recurrence risk is 3% with antihormone therapy. No benefit from chemotherapy; hence no chemotherapy recommended.  # Recommend antihormone therapy with Arimidex 1 mg once a day for 5 years. Discussed the potential problems including hot flashes and risk of osteoporosis and arthralgias. Prescription given.  # Patient follow-up with me in approximately 4 weeks in [place]/ no labs.    No orders of the defined types were placed in this encounter.  All questions were answered. The patient  knows to call the clinic with any problems, questions or concerns.      Cammie Sickle, MD 09/02/2016 12:19 PM

## 2016-09-02 NOTE — Assessment & Plan Note (Signed)
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 Status post mastectomy. No role for any radiation.  # Low risk Oncotype- distant recurrence risk is 3% with antihormone therapy. No benefit from chemotherapy; hence no chemotherapy recommended.  # Recommend antihormone therapy with Arimidex 1 mg once a day for 5 years. Discussed the potential problems including hot flashes and risk of osteoporosis and arthralgias. Prescription given.  # Patient follow-up with me in approximately 4 weeks in [place]/ no labs.

## 2016-09-02 NOTE — Progress Notes (Signed)
Patient here to transfer medical care to Dr. Rogue Bussing. She has no medical complaints today.

## 2016-09-30 ENCOUNTER — Other Ambulatory Visit: Payer: Medicare Other

## 2016-09-30 ENCOUNTER — Ambulatory Visit: Payer: Medicare Other | Admitting: Internal Medicine

## 2016-10-05 ENCOUNTER — Other Ambulatory Visit: Payer: Medicare Other

## 2016-10-05 ENCOUNTER — Ambulatory Visit: Payer: Medicare Other | Admitting: Internal Medicine

## 2016-10-12 ENCOUNTER — Other Ambulatory Visit: Payer: Medicare Other

## 2016-10-12 ENCOUNTER — Ambulatory Visit: Payer: Medicare Other | Admitting: Internal Medicine

## 2016-10-19 ENCOUNTER — Inpatient Hospital Stay: Payer: Medicare Other

## 2016-10-19 ENCOUNTER — Inpatient Hospital Stay: Payer: Medicare Other | Attending: Oncology | Admitting: Internal Medicine

## 2016-10-19 VITALS — BP 122/79 | HR 80 | Temp 98.4°F | Wt 185.2 lb

## 2016-10-19 DIAGNOSIS — Z170 Estrogen receptor positive status [ER+]: Secondary | ICD-10-CM | POA: Insufficient documentation

## 2016-10-19 DIAGNOSIS — E785 Hyperlipidemia, unspecified: Secondary | ICD-10-CM | POA: Diagnosis not present

## 2016-10-19 DIAGNOSIS — C50512 Malignant neoplasm of lower-outer quadrant of left female breast: Secondary | ICD-10-CM | POA: Diagnosis not present

## 2016-10-19 DIAGNOSIS — Z9012 Acquired absence of left breast and nipple: Secondary | ICD-10-CM | POA: Diagnosis not present

## 2016-10-19 DIAGNOSIS — M8580 Other specified disorders of bone density and structure, unspecified site: Secondary | ICD-10-CM | POA: Diagnosis not present

## 2016-10-19 DIAGNOSIS — Z87891 Personal history of nicotine dependence: Secondary | ICD-10-CM | POA: Insufficient documentation

## 2016-10-19 DIAGNOSIS — R232 Flushing: Secondary | ICD-10-CM

## 2016-10-19 DIAGNOSIS — Z79811 Long term (current) use of aromatase inhibitors: Secondary | ICD-10-CM | POA: Diagnosis not present

## 2016-10-19 DIAGNOSIS — Z800 Family history of malignant neoplasm of digestive organs: Secondary | ICD-10-CM | POA: Diagnosis not present

## 2016-10-19 DIAGNOSIS — Z79899 Other long term (current) drug therapy: Secondary | ICD-10-CM | POA: Insufficient documentation

## 2016-10-19 NOTE — Assessment & Plan Note (Addendum)
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 Status post mastectomy. No role for any radiation. Low risk Oncotype- On Arimidex.   # Recommend antihormone therapy with Arimidex 1 mg once a day for 5 years.   # Hot flashes- recommend Effexor if get worse.  # BMD- May 2017- Osteopenia- Ca+ Vit D; recommend BMD in 2 years.   # Patient follow-up with me in approximately 3 months in [place]/ no labs.

## 2016-10-19 NOTE — Progress Notes (Signed)
Patient here for follow up. Having hot flashes.

## 2016-10-19 NOTE — Progress Notes (Signed)
[facility] OFFICE PROGRESS NOTE  Patient Care Team: Gayland Curry, MD as PCP - General (Family Medicine)  Carcinoma of lower-outer quadrant of left breast in female, estrogen receptor positive [facility])   Staging form: Breast, AJCC 7th Edition   - Clinical stage from 06/18/2016: Stage IA (T1c, N0, M0) - Signed by Lloyd Huger, MD on 06/18/2016   Oncology History   # 2017 BREAST CANCER LEFT s/p Mastec; ER/PR POS; her 2 NEG; Oncotype LOW risk; Recom Arimidex  # BMD May 2017- Osteopenia/ ca+ Vit D     Carcinoma of lower-outer quadrant of left breast in female, estrogen receptor positive ([place])   06/18/2016 Initial Diagnosis    Breast cancer of lower-outer quadrant of left female breast [facility])       INTERVAL HISTORY:  Melanie Simmons 75 y.o.  female stage I breast cancer status post mastectomy; currently on Arimidex is here for follow-up  Patient has not noted her hot flashes getting any worse. Denies any unusual aches and pains.  REVIEW OF SYSTEMS:  A complete 10 point review of system is done which is negative except mentioned above/history of present illness.   PAST MEDICAL HISTORY :  Past Medical History:  Diagnosis Date  . Breast cancer ([place])   . Hyperlipidemia   . PONV (postoperative nausea and vomiting)   . Vertigo     PAST SURGICAL HISTORY :   Past Surgical History:  Procedure Laterality Date  . ABDOMINAL HYSTERECTOMY    . BREAST BIOPSY Left 06/09/2016   left [place] core bx  . BREAST CYST ASPIRATION Left    neg  . MASTECTOMY W/ SENTINEL NODE BIOPSY Left 07/16/2016   Procedure: MASTECTOMY WITH SENTINEL LYMPH NODE BIOPSY;  Surgeon: Hubbard Robinson, MD;  Location: [facility] ORS;  Service: General;  Laterality: Left;  . PILONIDAL CYST / SINUS EXCISION    . TOE SURGERY      FAMILY HISTORY :   Family History  Problem Relation Age of Onset  . Cancer Mother 54    Liver    SOCIAL HISTORY:  Lives in [place].  Social History  Substance Use Topics  .  Smoking status: Former Research scientist (life sciences)  . Smokeless tobacco: Never Used  . Alcohol use 0.0 oz/week     Comment: 1 Scotch Daily    ALLERGIES:  has No Known Allergies.  MEDICATIONS:  Current Outpatient Prescriptions  Medication Sig Dispense Refill  . anastrozole (ARIMIDEX) 1 MG tablet Take 1 tablet (1 mg total) by mouth daily. 90 tablet 3  . Calcium 600-200 MG-UNIT tablet Take 1 tablet by mouth 2 (two) times daily.    . Multiple Vitamin (MULTIVITAMIN) capsule Take 1 capsule by mouth daily.     Marland Kitchen triamterene-hydrochlorothiazide (DYAZIDE) 37.5-25 MG capsule Take 1 capsule by mouth daily.      No current facility-administered medications for this visit.     PHYSICAL EXAMINATION: ECOG PERFORMANCE STATUS: 0 - Asymptomatic  BP 122/79 (BP Location: Left Arm, Patient Position: Sitting)   Pulse 80   Temp 98.4 F (36.9 C) (Tympanic)   Wt 185 lb 3 oz (84 kg)   BMI 29.00 kg/m   Filed Weights   10/19/16 1513  Weight: 185 lb 3 oz (84 kg)    GENERAL: Well-nourished well-developed; Alert, no distress and comfortable.   Alone EYES: no pallor or icterus OROPHARYNX: no thrush or ulceration; good dentition  NECK: supple, no masses felt LYMPH:  no palpable lymphadenopathy in the cervical, axillary or inguinal regions  LUNGS: clear to auscultation and  No wheeze or crackles HEART/CVS: regular rate & rhythm and no murmurs; No lower extremity edema ABDOMEN:abdomen soft, non-tender and normal bowel sounds Musculoskeletal:no cyanosis of digits and no clubbing  PSYCH: alert & oriented x 3 with fluent speech NEURO: no focal motor/sensory deficits SKIN:  no rashes or significant lesions  LABORATORY DATA:  I have reviewed the data as listed    Component Value Date/Time   NA 140 07/08/2016 1353   NA 142 09/11/2012 2242   K 3.6 07/08/2016 1353   K 3.8 09/11/2012 2242   CL 100 (L) 07/08/2016 1353   CL 104 09/11/2012 2242   CO2 33 (H) 07/08/2016 1353   CO2 29 09/11/2012 2242   GLUCOSE 100 (H)  07/08/2016 1353   GLUCOSE 94 09/11/2012 2242   BUN 32 (H) 07/08/2016 1353   BUN 27 (H) 09/11/2012 2242   CREATININE 0.83 07/08/2016 1353   CREATININE 0.80 09/11/2012 2242   CALCIUM 10.2 07/08/2016 1353   CALCIUM 9.7 09/11/2012 2242   PROT 7.5 09/11/2012 2242   ALBUMIN 4.4 09/11/2012 2242   AST 19 09/11/2012 2242   ALT 19 09/11/2012 2242   ALKPHOS 72 09/11/2012 2242   BILITOT 0.4 09/11/2012 2242   GFRNONAA >60 07/08/2016 1353   GFRNONAA >60 09/11/2012 2242   GFRAA >60 07/08/2016 1353   GFRAA >60 09/11/2012 2242    No results found for: SPEP, UPEP  Lab Results  Component Value Date   WBC 6.4 07/08/2016   NEUTROABS 4.3 07/08/2016   HGB 14.3 07/08/2016   HCT 41.1 07/08/2016   MCV 91.1 07/08/2016   PLT 249 07/08/2016      Chemistry      Component Value Date/Time   NA 140 07/08/2016 1353   NA 142 09/11/2012 2242   K 3.6 07/08/2016 1353   K 3.8 09/11/2012 2242   CL 100 (L) 07/08/2016 1353   CL 104 09/11/2012 2242   CO2 33 (H) 07/08/2016 1353   CO2 29 09/11/2012 2242   BUN 32 (H) 07/08/2016 1353   BUN 27 (H) 09/11/2012 2242   CREATININE 0.83 07/08/2016 1353   CREATININE 0.80 09/11/2012 2242      Component Value Date/Time   CALCIUM 10.2 07/08/2016 1353   CALCIUM 9.7 09/11/2012 2242   ALKPHOS 72 09/11/2012 2242   AST 19 09/11/2012 2242   ALT 19 09/11/2012 2242   BILITOT 0.4 09/11/2012 2242       RADIOGRAPHIC STUDIES: I have personally reviewed the radiological images as listed and agreed with the findings in the report. No results found.   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 Status post mastectomy. No role for any radiation. Low risk Oncotype- On Arimidex.   # Recommend antihormone therapy with Arimidex 1 mg once a day for 5 years.   # Hot flashes- recommend Effexor if get worse.  # BMD- May 2017- Osteopenia- Ca+ Vit D;  recommend BMD in 2 years.   # Patient follow-up with me in approximately 3 months in [place]/ no labs.   No orders of the defined types were placed in this encounter.     Cammie Sickle, MD 10/19/2016 3:46 PM

## 2016-10-29 IMAGING — MG MM DIGITAL DIAGNOSTIC UNILAT*L*
2 series · 2 of 2 positions shown · non-contrast
Comparison: Previous exam(s).

CLINICAL DATA: Status post ultrasound-guided biopsy of a left
breast mass earlier today.

EXAM:
DIAGNOSTIC LEFT MAMMOGRAM POST ULTRASOUND BIOPSY

[L ML]
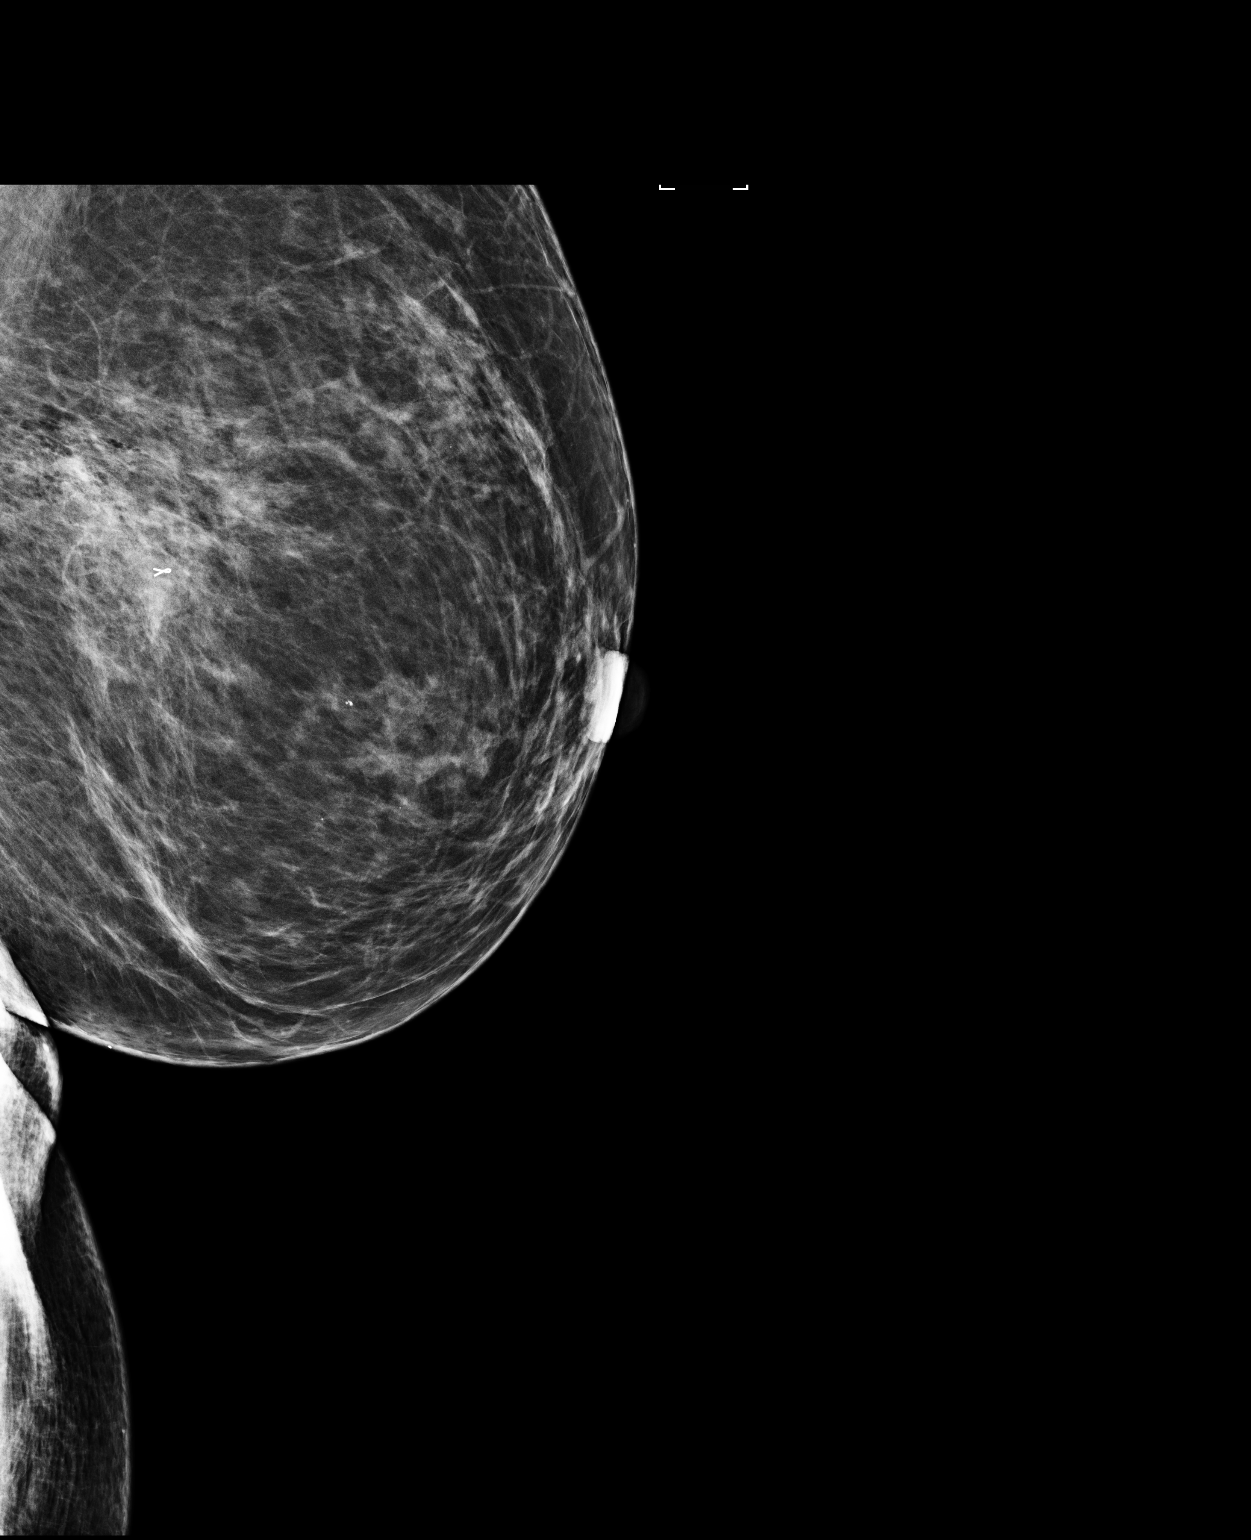

[L CC]
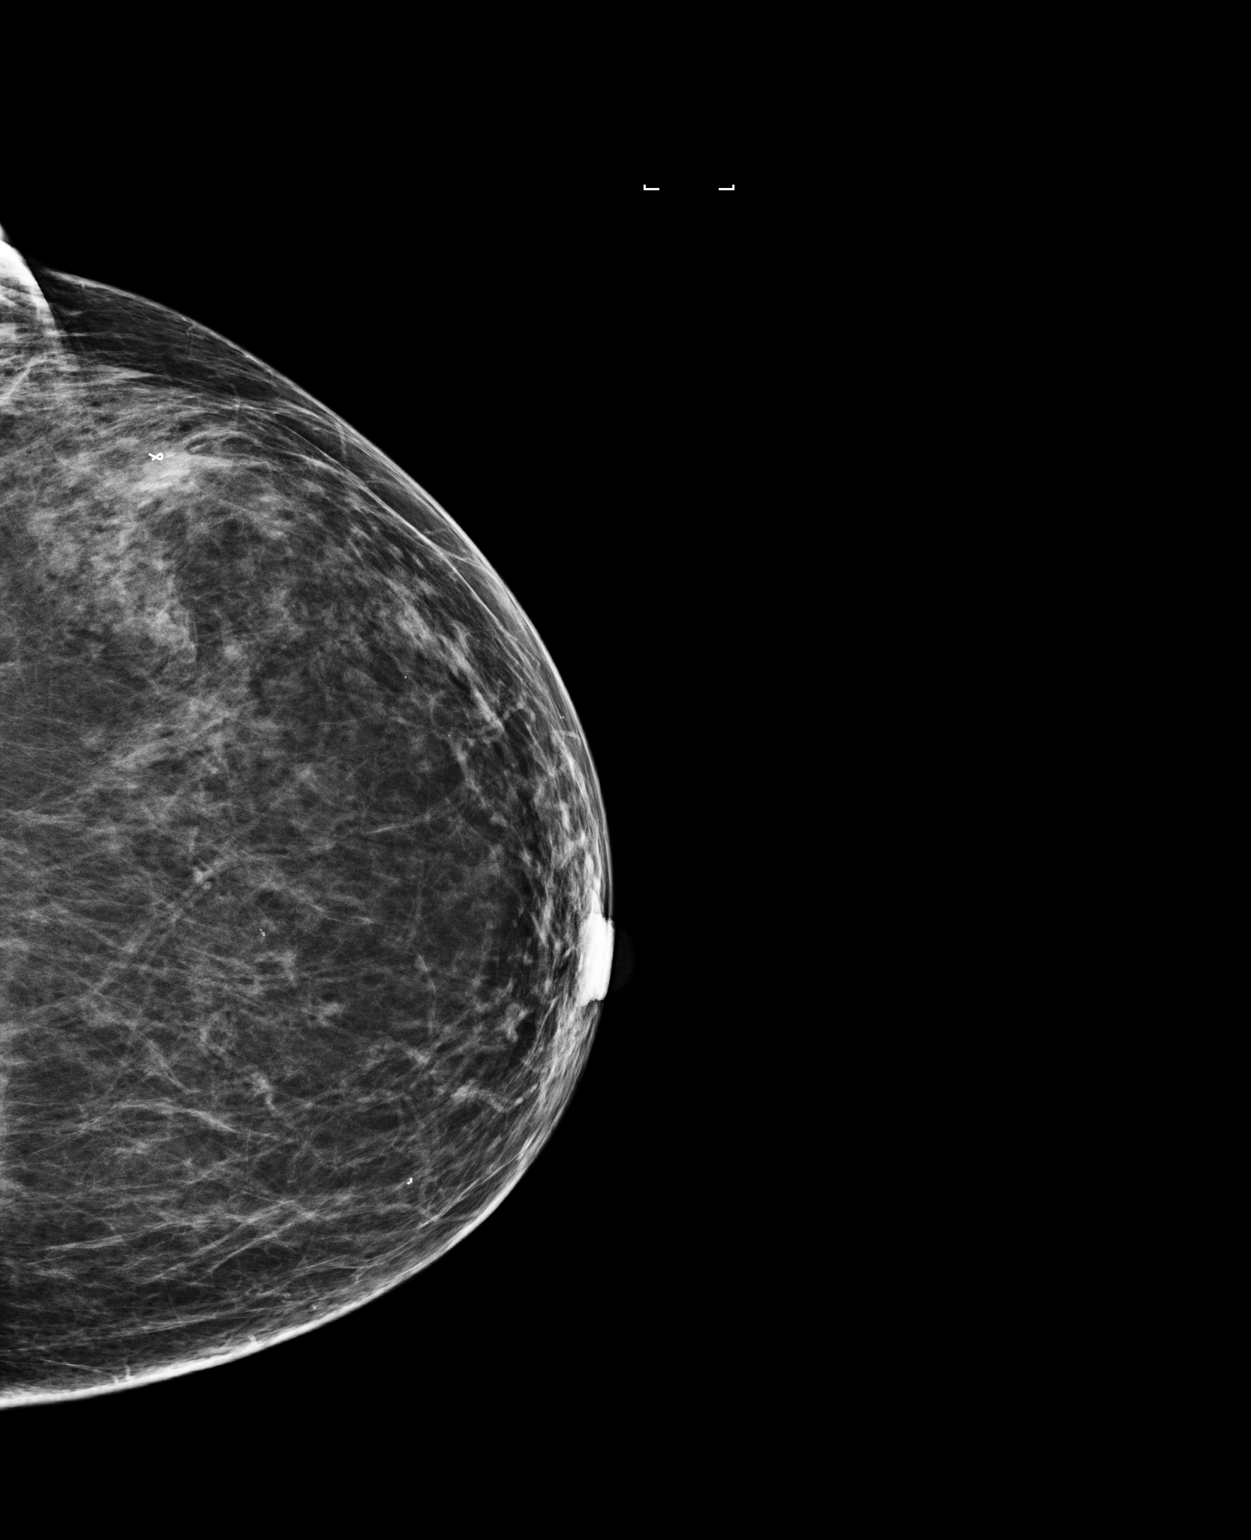

[2 of 2 positions shown; findings below may reference images not displayed]

FINDINGS: Mammographic images were obtained following ultrasound guided biopsy
of the left breast mass at the 3:30 o'clock axis, 8 cm from the
nipple. At the conclusion of the procedure, a ribbon shaped tissue
marker was placed at the biopsy site. This biopsy clip is well
positioned within the targeted mass.
IMPRESSION: Postprocedure mammogram for clip placement. Biopsy clip is well
positioned within the targeted mass in the outer left breast.

Final Assessment: Post Procedure Mammograms for Marker Placement

## 2016-10-29 IMAGING — MG US BREAST BX W LOC DEV 1ST LESION IMG BX SPEC US GUIDE*L*
1 series · 8 of 8 positions shown · non-contrast
Comparison: Previous exam(s).

ADDENDUM:
Pathology of the left breast biopsy revealed INVASIVE MAMMARY
CARCINOMA OF NO SPECIAL TYPE. TUMOR SIZE IN THIS CORE SAMPLE: 5 MM.
PRELIMINARY GRADE: 1 (ARINDAM HISTOLOGIC GRADE). Note: ER, PR and
HER2-Alfrhan immunohistochemistry is obtained and results will be issued
in an addendum. HER2 FISH will be performed for equivocal results.
Final histologic grade should be based on the excised tumor. Results
were called to [facility] of [HOSPITAL] Breast [HOSPITAL] on 06/11/16
at [DATE]. This was found to be concordant by Dr. Giorgi.

Recommendations:  Surgical referral.
At the patient's request, pathology and recommendations were relayed
to the patient by Dr. Jerky on 06/11/16. The patient stated she did
well following the biopsy with no bleeding or hematoma. All of her
questions were answered. She was encouraged to call the [HOSPITAL]
[REDACTED] with any further
questions or concerns. A surgical consultation will be made for the
patient by the nurse navigators at [HOSPITAL].
The patient will be contacted by the navigators with the
information.
The nurse navigators were contacted by Erhunse, Anciha with
results and recommendations. An appointment was made for the patient
to see Dr. Andrelina (oncology) on 06/18/16 at [DATE] and Dr. Roberto
(surgeon) for 06/18/16 at [DATE] by Annlyn Starz, RN, nurse
navigator. The patient has been notified of the appointments.
Addendum by Erhunse, Anciha on 06/16/16.
CLINICAL DATA: Patient with a left breast mass presents today for
ultrasound-guided biopsy.
EXAM:
ULTRASOUND GUIDED LEFT BREAST CORE NEEDLE BIOPSY

[Series 1: MG view · 0.05mm/px · 8 of 13 slices shown]
[im 1/13]
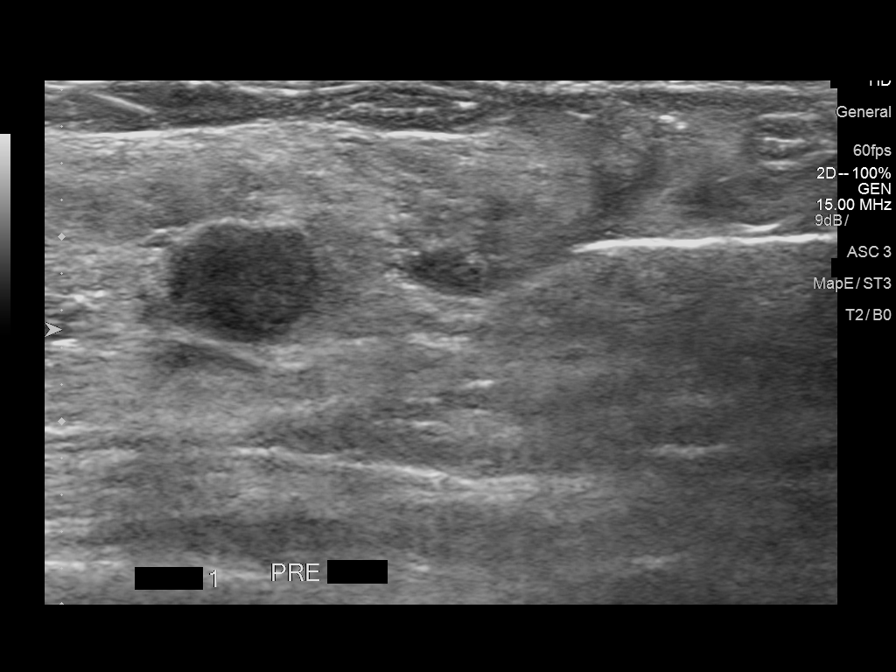
[im 2/13]
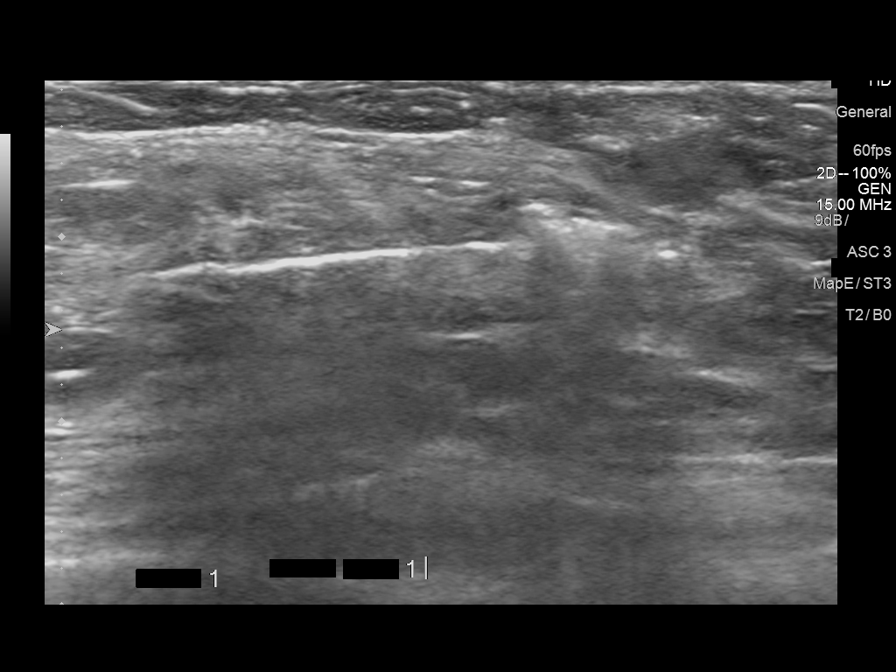
[im 4/13]
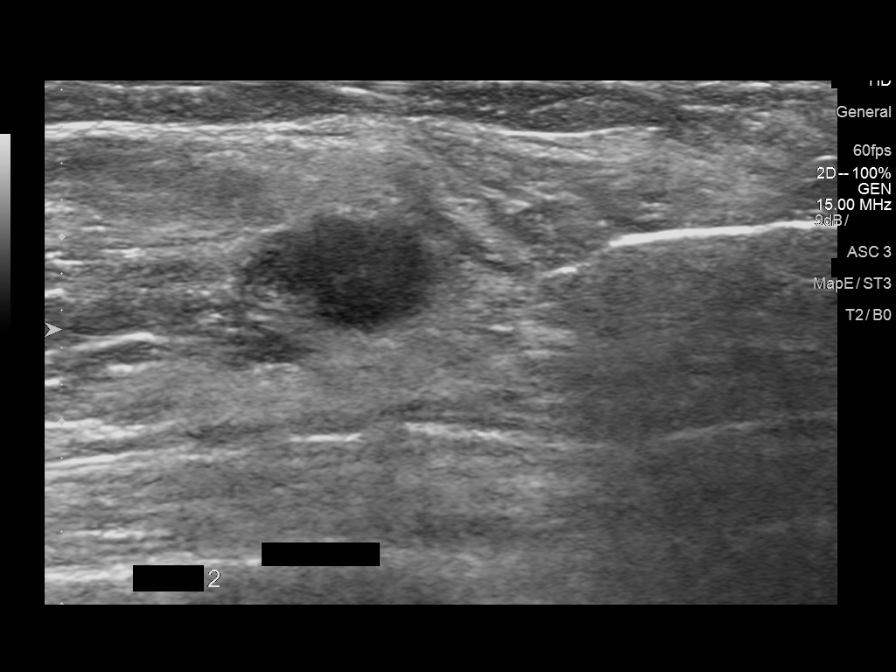
[im 6/13]
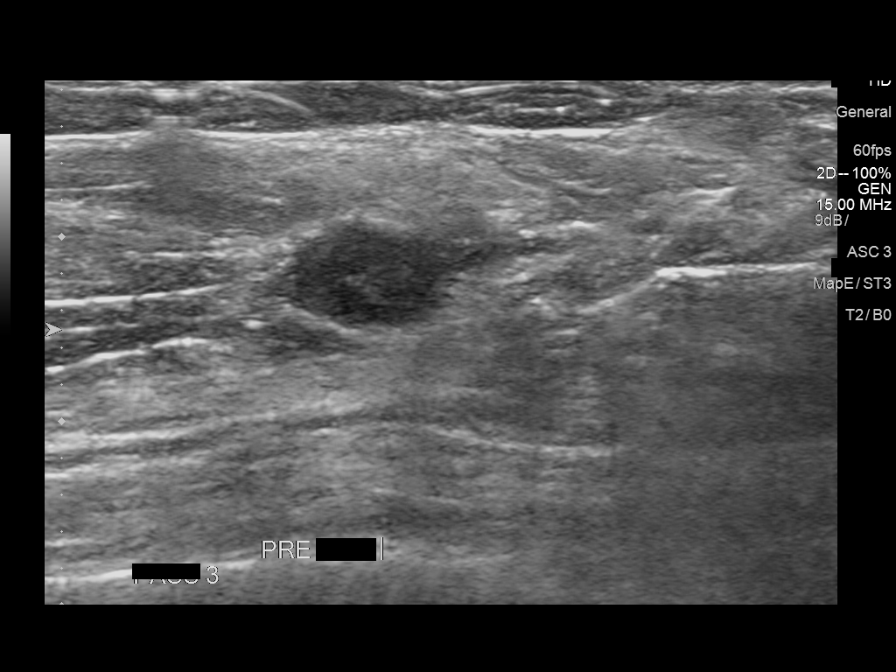
[im 7/13]
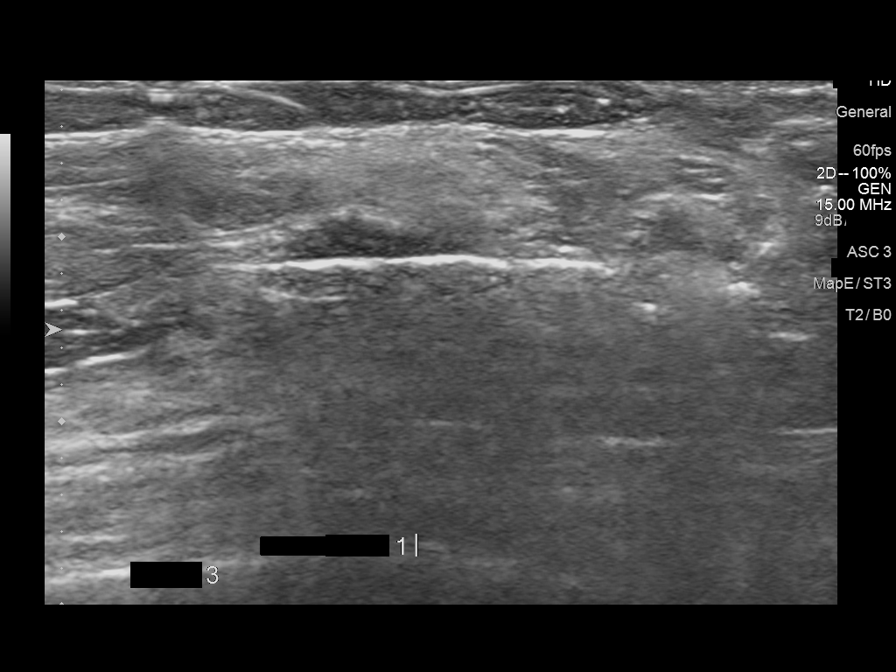
[im 9/13]
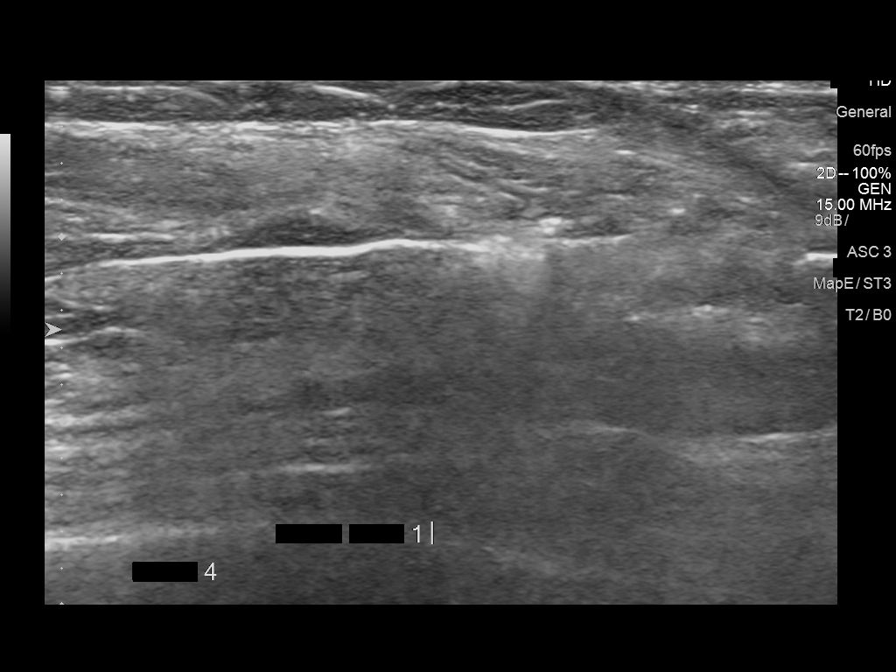
[im 11/13]
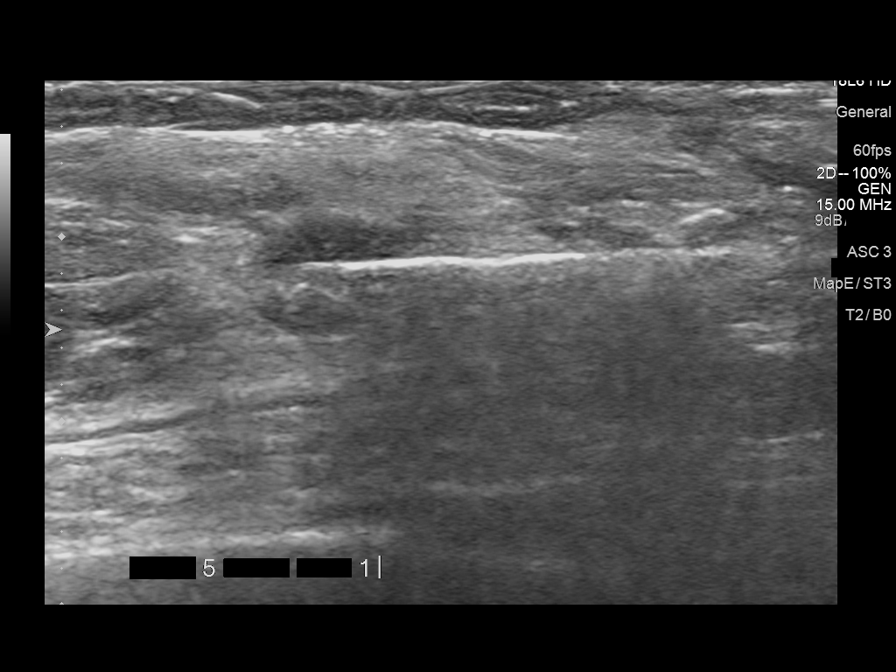
[im 13/13]
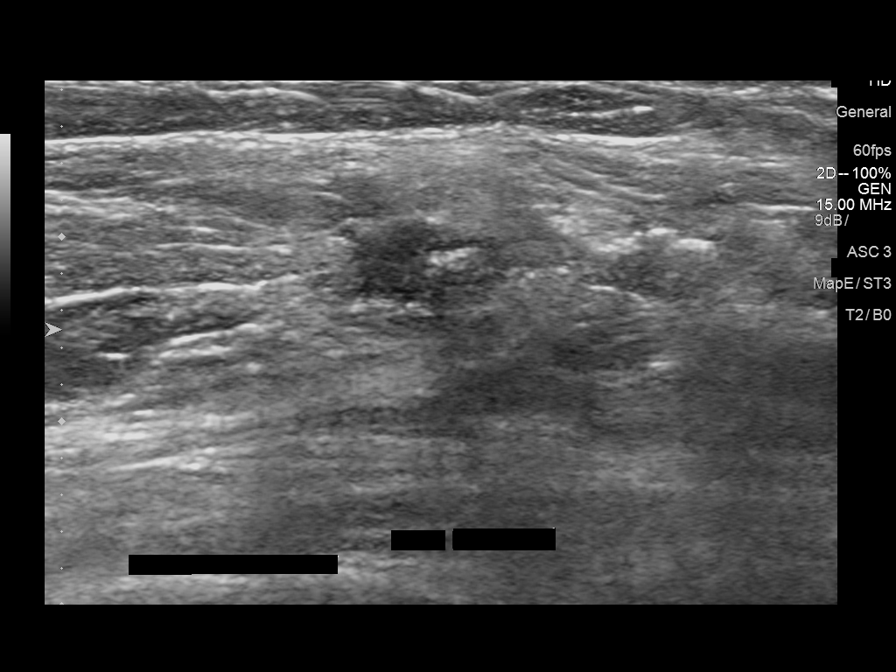

[8 of 8 positions shown; findings below may reference images not displayed]

PROCEDURE:
I met with the patient and we discussed the procedure of
ultrasound-guided biopsy, including benefits and alternatives. We
discussed the high likelihood of a successful procedure. We
discussed the risks of the procedure including infection, bleeding,
tissue injury, clip migration, and inadequate sampling. Informed
written consent was given. The usual time-out protocol was performed
immediately prior to the procedure.

Using sterile technique and 1% Lidocaine as local anesthetic, under
direct ultrasound visualization, a 12 gauge Salay device was
used to perform biopsy of the complex cystic and solid mass within
the outer left breast, 3:30 o'clock axis region, 8 cm from the
nipple,using a lateral approach. At the conclusion of the procedure,
a ribbon shaped tissue marker clip was deployed into the biopsy
cavity. Follow-up 2-view mammogram was performed and dictated
separately.
IMPRESSION: Ultrasound-guided biopsy of left breast mass at the 3:30 o'clock
axis. No apparent complications.

## 2016-11-27 IMAGING — CR DG CHEST 2V
2 series · 2 of 2 positions shown · non-contrast
Comparison: None.

CLINICAL DATA: Preoperative for mastectomy.

EXAM:
CHEST  2 VIEW

[chest pa]
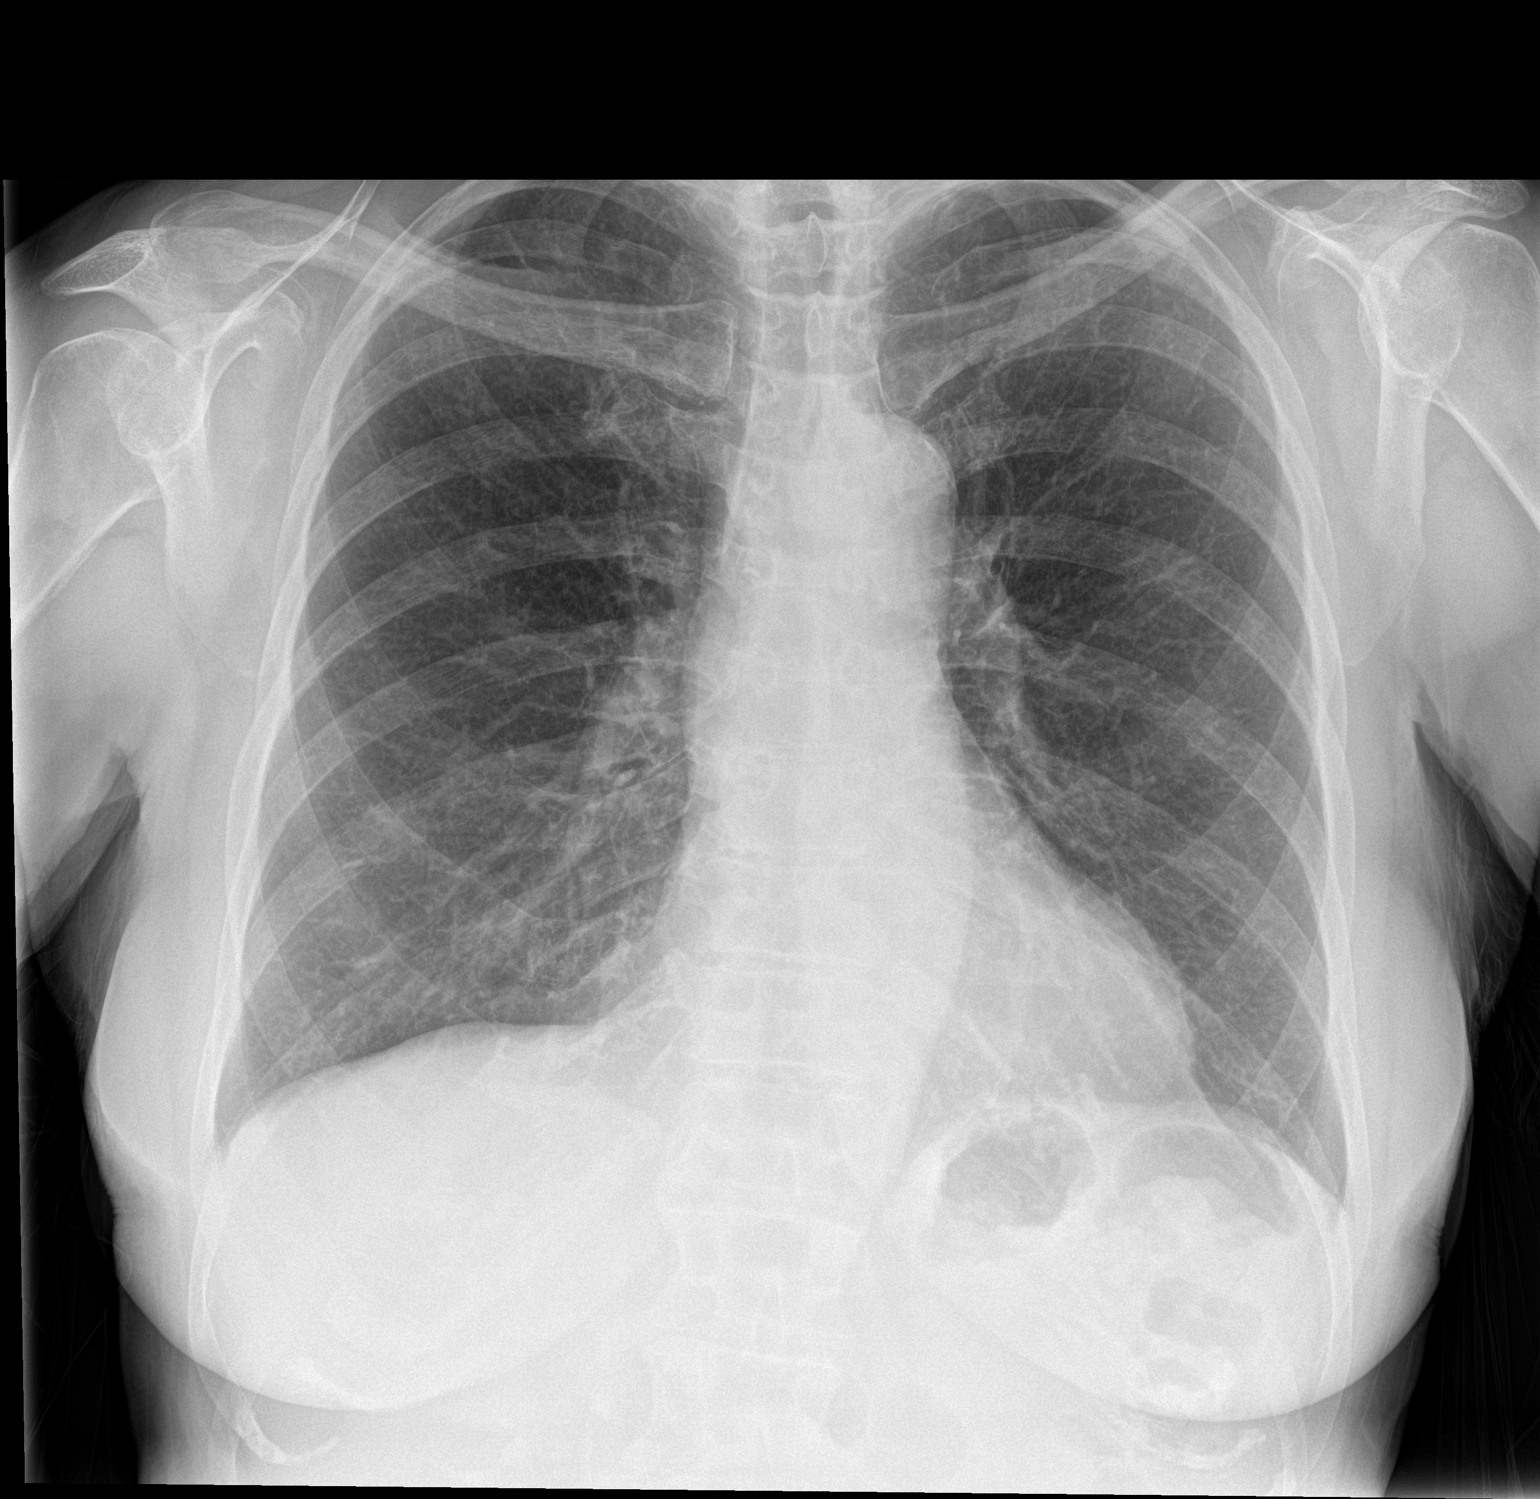

[chest lat]
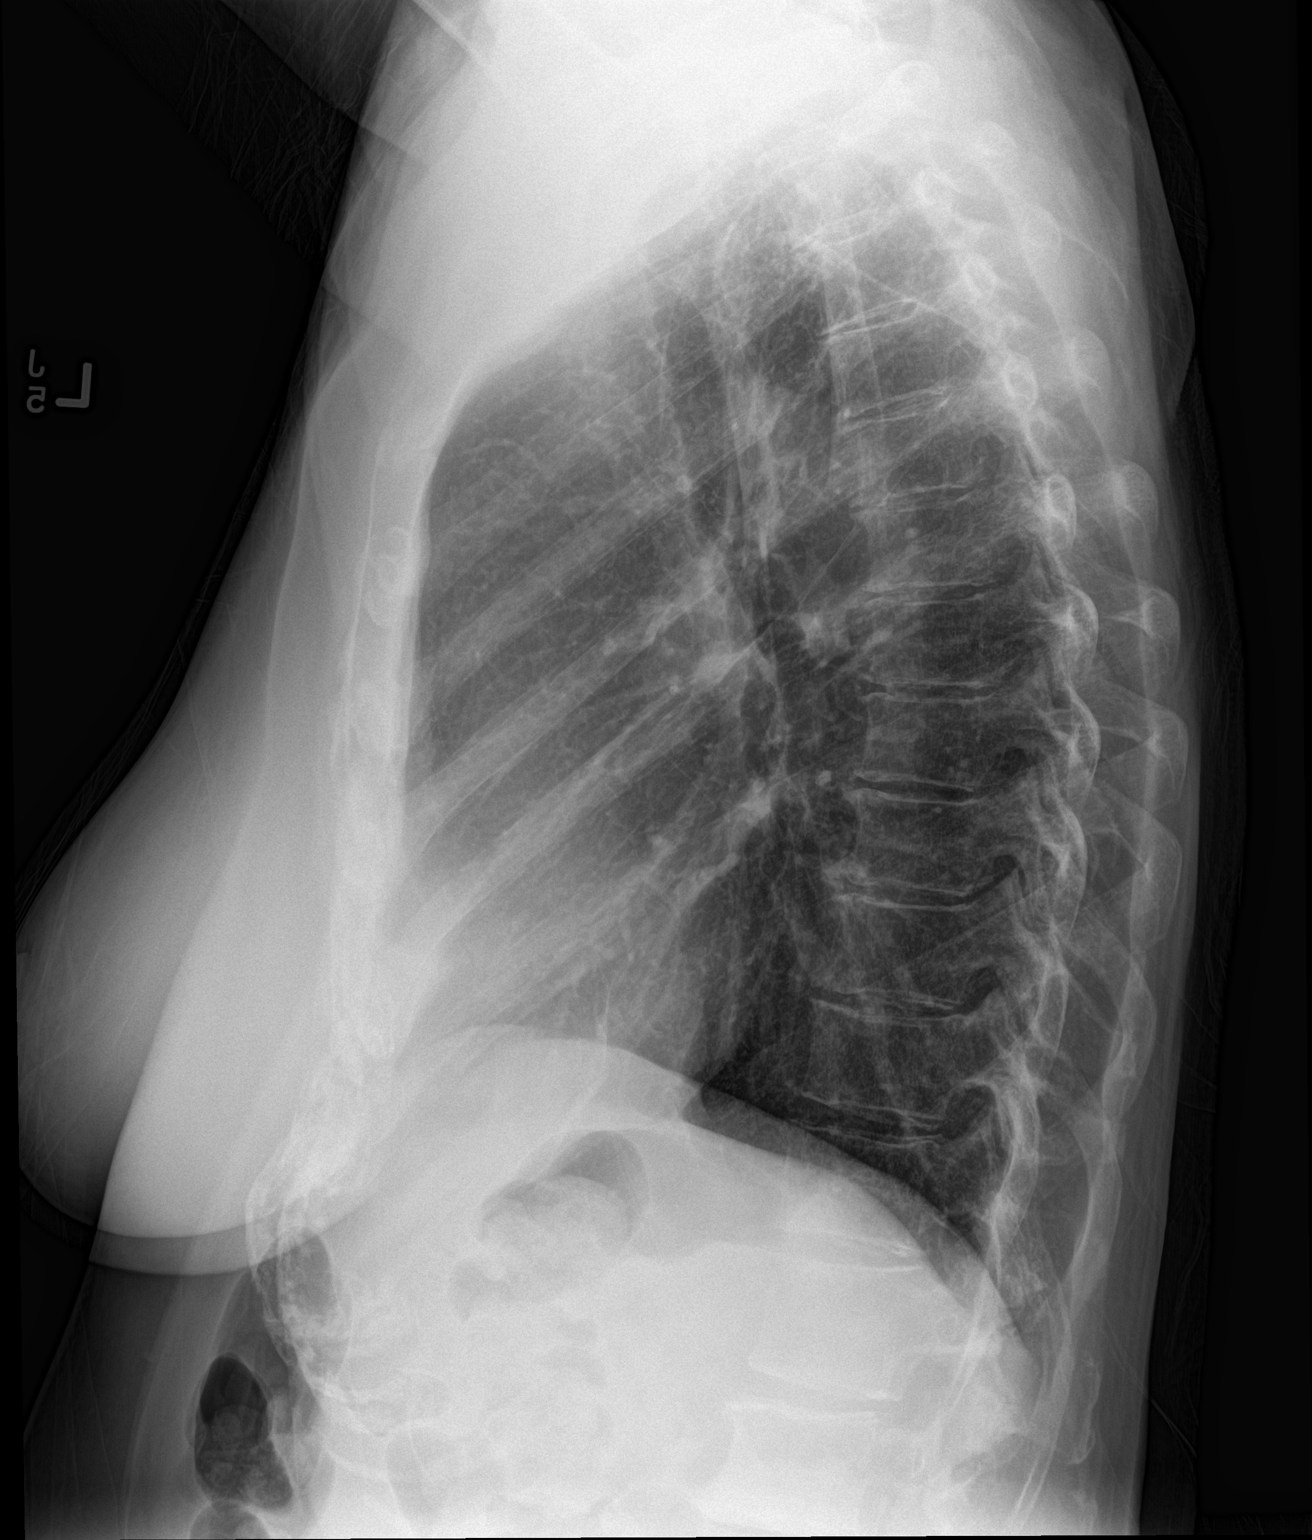

[2 of 2 positions shown; findings below may reference images not displayed]

FINDINGS: The heart size and mediastinal contours are within normal limits.
Both lungs are clear. The visualized skeletal structures are
unremarkable.
IMPRESSION: No active cardiopulmonary disease.

## 2017-01-18 ENCOUNTER — Inpatient Hospital Stay: Payer: 59 | Admitting: Internal Medicine

## 2017-01-18 ENCOUNTER — Inpatient Hospital Stay: Payer: Medicare Other | Attending: Internal Medicine | Admitting: Internal Medicine

## 2017-01-18 DIAGNOSIS — Z9012 Acquired absence of left breast and nipple: Secondary | ICD-10-CM

## 2017-01-18 DIAGNOSIS — Z170 Estrogen receptor positive status [ER+]: Secondary | ICD-10-CM | POA: Insufficient documentation

## 2017-01-18 DIAGNOSIS — Z79811 Long term (current) use of aromatase inhibitors: Secondary | ICD-10-CM | POA: Diagnosis not present

## 2017-01-18 DIAGNOSIS — Z87891 Personal history of nicotine dependence: Secondary | ICD-10-CM | POA: Diagnosis not present

## 2017-01-18 DIAGNOSIS — C50512 Malignant neoplasm of lower-outer quadrant of left female breast: Secondary | ICD-10-CM | POA: Insufficient documentation

## 2017-01-18 DIAGNOSIS — Z79899 Other long term (current) drug therapy: Secondary | ICD-10-CM | POA: Diagnosis not present

## 2017-01-18 DIAGNOSIS — R232 Flushing: Secondary | ICD-10-CM | POA: Diagnosis not present

## 2017-01-18 DIAGNOSIS — M8580 Other specified disorders of bone density and structure, unspecified site: Secondary | ICD-10-CM | POA: Diagnosis not present

## 2017-01-18 DIAGNOSIS — E785 Hyperlipidemia, unspecified: Secondary | ICD-10-CM | POA: Insufficient documentation

## 2017-01-18 NOTE — Progress Notes (Signed)
Patient here today for follow up.  Patient states no new concerns today  

## 2017-01-18 NOTE — Assessment & Plan Note (Addendum)
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 Status post mastectomy. No role for any radiation. Low risk Oncotype- On Arimidex.   # Continue antihormone therapy with Arimidex 1 mg once a day for 5 years to be completed on Sept. 2022.   # Hot flashes- Much better. She is having one per month. Will monitor.   # Weight Gain- Continue healthy diet and exercise.   # BMD- May 2017- Osteopenia- Ca+ Vit D; recommend BMD in 2 years.   # Patient follow-up with MD in 4 months.

## 2017-01-18 NOTE — Progress Notes (Signed)
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  female stage I breast cancer status post mastectomy; currently on Arimidex is here for follow-up  Patient has not noted her hot flashes are getting much better. Denies any unusual aches and pains.  REVIEW OF SYSTEMS:  A complete 10 point review of system is done which is negative except mentioned above/history of present illness.   PAST MEDICAL HISTORY :  Past Medical History:  Diagnosis Date  . Breast cancer ([place])   . Hyperlipidemia   . PONV (postoperative nausea and vomiting)   . Vertigo     PAST SURGICAL HISTORY :   Past Surgical History:  Procedure Laterality Date  . ABDOMINAL HYSTERECTOMY    . BREAST BIOPSY Left 06/09/2016   left [place] core bx  . BREAST CYST ASPIRATION Left    neg  . MASTECTOMY W/ SENTINEL NODE BIOPSY Left 07/16/2016   Procedure: MASTECTOMY WITH SENTINEL LYMPH NODE BIOPSY;  Surgeon: Hubbard Robinson, MD;  Location: [facility] ORS;  Service: General;  Laterality: Left;  . PILONIDAL CYST / SINUS EXCISION    . TOE SURGERY      FAMILY HISTORY :   Family History  Problem Relation Age of Onset  . Cancer Mother 18    Liver    SOCIAL HISTORY:  Lives in [place].  Social History   Substance Use Topics  . Smoking status: Former Research scientist (life sciences)  . Smokeless tobacco: Never Used  . Alcohol use 0.0 oz/week     Comment: 1 Scotch Daily    ALLERGIES:  has No Known Allergies.  MEDICATIONS:  Current Outpatient Prescriptions  Medication Sig Dispense Refill  . anastrozole (ARIMIDEX) 1 MG tablet Take 1 tablet (1 mg total) by mouth daily. 90 tablet 3  . Calcium 600-200 MG-UNIT tablet Take 1 tablet by mouth 2 (two) times daily.    . Multiple Vitamin (MULTIVITAMIN) capsule Take 1 capsule by mouth daily.     Marland Kitchen triamterene-hydrochlorothiazide (DYAZIDE) 37.5-25 MG capsule Take 1 capsule by mouth daily.      No current facility-administered medications for this visit.     PHYSICAL EXAMINATION: ECOG PERFORMANCE STATUS: 0 - Asymptomatic  BP 106/72 (BP Location: Right Arm, Patient Position: Sitting)   Pulse 65   Temp 97.4 F (36.3 C) (Tympanic)   Wt 188 lb 2.6 oz (85.3 kg)   BMI 29.47 kg/m   Filed Weights   01/18/17 1410  Weight: 188 lb 2.6 oz (85.3 kg)    GENERAL: Well-nourished well-developed; Alert, no distress and comfortable.   Alone EYES: no pallor or icterus OROPHARYNX: no thrush or ulceration; good dentition  NECK: supple, no masses felt LYMPH:  no palpable lymphadenopathy in the cervical, axillary or inguinal regions  LUNGS: clear to auscultation and  No wheeze or crackles HEART/CVS: regular rate & rhythm and no murmurs; No lower extremity edema ABDOMEN:abdomen soft, non-tender and normal bowel sounds Musculoskeletal:no cyanosis of digits and no clubbing  PSYCH: alert & oriented x 3 with fluent speech NEURO: no focal motor/sensory deficits SKIN:  no rashes or significant lesions  LABORATORY DATA:  I have reviewed the data as listed    Component Value Date/Time   NA 140 07/08/2016 1353   NA 142 09/11/2012 2242   K 3.6 07/08/2016 1353   K 3.8 09/11/2012 2242   CL 100 (L) 07/08/2016 1353   CL 104 09/11/2012 2242   CO2 33 (H) 07/08/2016 1353   CO2 29  09/11/2012 2242   GLUCOSE 100 (H) 07/08/2016 1353   GLUCOSE 94 09/11/2012 2242   BUN 32 (H) 07/08/2016 1353   BUN 27 (H) 09/11/2012 2242   CREATININE 0.83 07/08/2016 1353   CREATININE 0.80 09/11/2012 2242   CALCIUM 10.2 07/08/2016 1353   CALCIUM 9.7 09/11/2012 2242   PROT 7.5 09/11/2012 2242   ALBUMIN 4.4 09/11/2012 2242   AST 19 09/11/2012 2242   ALT 19 09/11/2012 2242   ALKPHOS 72 09/11/2012 2242   BILITOT 0.4 09/11/2012 2242   GFRNONAA >60 07/08/2016 1353   GFRNONAA >60 09/11/2012 2242   GFRAA >60 07/08/2016 1353   GFRAA >60 09/11/2012 2242    No results found for: SPEP, UPEP  Lab Results  Component Value Date   WBC 6.4 07/08/2016   NEUTROABS 4.3 07/08/2016   HGB 14.3 07/08/2016   HCT 41.1 07/08/2016   MCV 91.1 07/08/2016   PLT 249 07/08/2016      Chemistry      Component Value Date/Time   NA 140 07/08/2016 1353   NA 142 09/11/2012 2242   K 3.6 07/08/2016 1353   K 3.8 09/11/2012 2242   CL 100 (L) 07/08/2016 1353   CL 104 09/11/2012 2242   CO2 33 (H) 07/08/2016 1353   CO2 29 09/11/2012 2242   BUN 32 (H) 07/08/2016 1353   BUN 27 (H) 09/11/2012 2242   CREATININE 0.83 07/08/2016 1353   CREATININE 0.80 09/11/2012 2242      Component Value Date/Time   CALCIUM 10.2 07/08/2016 1353   CALCIUM 9.7 09/11/2012 2242   ALKPHOS 72 09/11/2012 2242   AST 19 09/11/2012 2242   ALT 19 09/11/2012 2242   BILITOT 0.4 09/11/2012 2242       RADIOGRAPHIC STUDIES: I have personally reviewed the radiological images as listed and agreed with the findings in the report. No results found.   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 Status post mastectomy. No role for any radiation. Low risk Oncotype- On Arimidex.   # Continue antihormone therapy with Arimidex 1 mg once a day for 5 years to be completed on Sept. 2022.   # Hot flashes- Much better.  She is having one per month. Will monitor.   # Weight Gain- Continue healthy diet and exercise.   # BMD- May 2017- Osteopenia- Ca+ Vit D; recommend BMD in 2 years.   # Patient follow-up with MD in 4 months.   No orders of the defined types were placed in this encounter.   Faythe Casa, NP   Jacquelin Hawking, NP 01/18/2017 3:11 PM

## 2017-01-26 ENCOUNTER — Telehealth: Payer: Self-pay

## 2017-01-26 NOTE — Telephone Encounter (Signed)
Called patient and had to leave her a voicemail.  Patient needs to see Dr. Adonis Huguenin for a 6 months follow up for her Left Breast Mastectomy. In case patient asks about a mammogram done, please let her know that it's due until after 05/03/2017 and this will be scheduled for her after she sees Dr. Adonis Huguenin.

## 2017-02-01 ENCOUNTER — Other Ambulatory Visit: Payer: Self-pay

## 2017-02-01 DIAGNOSIS — Z1231 Encounter for screening mammogram for malignant neoplasm of breast: Secondary | ICD-10-CM

## 2017-02-01 DIAGNOSIS — Z9289 Personal history of other medical treatment: Secondary | ICD-10-CM

## 2017-02-03 ENCOUNTER — Encounter: Payer: Self-pay | Admitting: General Surgery

## 2017-02-03 ENCOUNTER — Ambulatory Visit (INDEPENDENT_AMBULATORY_CARE_PROVIDER_SITE_OTHER): Payer: Medicare Other | Admitting: General Surgery

## 2017-02-03 VITALS — BP 128/80 | HR 66 | Temp 98.1°F | Ht 67.0 in | Wt 188.8 lb

## 2017-02-03 DIAGNOSIS — C50512 Malignant neoplasm of lower-outer quadrant of left female breast: Secondary | ICD-10-CM

## 2017-02-03 DIAGNOSIS — Z170 Estrogen receptor positive status [ER+]: Secondary | ICD-10-CM

## 2017-02-03 NOTE — Patient Instructions (Addendum)
Ill schedule your Mammogram and call you tomorrow. Please see your follow up appointment listed below.

## 2017-02-03 NOTE — Progress Notes (Signed)
Outpatient Surgical Follow Up  02/03/2017  Melanie Simmons is an 76 y.o. female.   Chief Complaint  Patient presents with  . Follow-up    Left Breast Mastectomy (07/16/16)- Dr. Azalee Course    HPI: 76 year old female returns to clinic for 6 month follow-up from a left-sided breast cancer. She underwent a mastectomy last July and has completed her postoperative recovery. She reports continuing to do self breast exams and wound checks without any new or unusual findings. She denies any findings of breast masses. She denies any fevers, chills, nausea, vomiting, chest pain, shortness of breath, diarrhea, constipation. She is otherwise doing very well.  Past Medical History:  Diagnosis Date  . Breast cancer ([place])   . Hyperlipidemia   . PONV (postoperative nausea and vomiting)   . Vertigo     Past Surgical History:  Procedure Laterality Date  . ABDOMINAL HYSTERECTOMY    . BREAST BIOPSY Left 06/09/2016   left [place] core bx  . BREAST CYST ASPIRATION Left    neg  . MASTECTOMY W/ SENTINEL NODE BIOPSY Left 07/16/2016   Procedure: MASTECTOMY WITH SENTINEL LYMPH NODE BIOPSY;  Surgeon: Hubbard Robinson, MD;  Location: [facility] ORS;  Service: General;  Laterality: Left;  . PILONIDAL CYST / SINUS EXCISION    . TOE SURGERY      Family History  Problem Relation Age of Onset  . Cancer Mother 82    Liver    Social History:  reports that she has quit smoking. She has never used smokeless tobacco. She reports that she drinks alcohol. She reports that she does not use drugs.  Allergies: No Known Allergies  Medications reviewed.    ROS A multipoint review of systems was completed. All pertinent positives and negatives are documented within the history of present illness and remainder are negative.   BP 128/80   Pulse 66   Temp 98.1 F (36.7 C) (Oral)   Ht 5\' 7"  (1.702 m)   Wt 85.6 kg (188 lb 12.8 oz)   BMI 29.57 kg/m   Physical Exam Gen.: No acute distress Neck: Supple and nontender Lymph  nodes: No evidence of cervical, clavicular, axillary lymphadenopathy Breasts: Left breast surgically absent. Well-healed mastectomy incision site with some asymmetry to the inferior and lateral aspects of the mastectomy. No palpable masses or concerning findings. Right breast without any concerning findings, masses, lumps Chest: Clear to auscultation Heart: Regular rhythm Abdomen: Soft and nontender    No results found for this or any previous visit (from the past 48 hour(s)). No results found.  Assessment/Plan:  1. Malignant neoplasm of lower-outer quadrant of left breast of female, estrogen receptor positive ([place]) 76 year old female with history of left breast cancer status post mastectomy. Doing very well. Discussed the importance of continuing annual imaging on the right breast. Also discussed the importance of continuing self breast exams. She'll follow-up in clinic at her one-year [place] from her surgery. - MM Digital Diagnostic Unilat R; Future - US BREAST LTD UNI RIGHT INC AXILLA; Future     Clayburn Pert, MD [facility] General Surgeon  02/03/2017,4:38 PM

## 2017-02-04 ENCOUNTER — Telehealth: Payer: Self-pay

## 2017-02-04 NOTE — Telephone Encounter (Signed)
Left Message at this time for patient to call office regarding appointments listed below.  Reminders mailed today.    Mammogram Appointment- 05/04/17 @ 9:00 am [place] Dr.Woodham Follow up-07/14/17.

## 2017-02-04 NOTE — Telephone Encounter (Signed)
Patient called to see what time her mammogram appointment in [place] is. I read the note documented by Freda Munro to her. Patient understands the date, time and location.

## 2017-02-07 NOTE — Telephone Encounter (Signed)
Patient came in on 02/03/2017 to see Dr. Adonis Huguenin. Her Mammogram was scheduled to be done on 05/04/2017 and the she will follow up with Dr. Adonis Huguenin on 07/14/2017.

## 2017-05-03 ENCOUNTER — Telehealth: Payer: Self-pay | Admitting: Internal Medicine

## 2017-05-03 ENCOUNTER — Inpatient Hospital Stay: Payer: Medicare Other | Attending: Internal Medicine | Admitting: Internal Medicine

## 2017-05-03 VITALS — BP 117/75 | HR 73 | Temp 97.8°F | Resp 16

## 2017-05-03 DIAGNOSIS — R232 Flushing: Secondary | ICD-10-CM | POA: Diagnosis not present

## 2017-05-03 DIAGNOSIS — Z79811 Long term (current) use of aromatase inhibitors: Secondary | ICD-10-CM | POA: Insufficient documentation

## 2017-05-03 DIAGNOSIS — Z79899 Other long term (current) drug therapy: Secondary | ICD-10-CM | POA: Diagnosis not present

## 2017-05-03 DIAGNOSIS — Z170 Estrogen receptor positive status [ER+]: Secondary | ICD-10-CM | POA: Diagnosis not present

## 2017-05-03 DIAGNOSIS — R635 Abnormal weight gain: Secondary | ICD-10-CM

## 2017-05-03 DIAGNOSIS — E785 Hyperlipidemia, unspecified: Secondary | ICD-10-CM | POA: Insufficient documentation

## 2017-05-03 DIAGNOSIS — C50512 Malignant neoplasm of lower-outer quadrant of left female breast: Secondary | ICD-10-CM | POA: Insufficient documentation

## 2017-05-03 DIAGNOSIS — Z9012 Acquired absence of left breast and nipple: Secondary | ICD-10-CM

## 2017-05-03 DIAGNOSIS — Z87891 Personal history of nicotine dependence: Secondary | ICD-10-CM | POA: Insufficient documentation

## 2017-05-03 DIAGNOSIS — M8580 Other specified disorders of bone density and structure, unspecified site: Secondary | ICD-10-CM | POA: Insufficient documentation

## 2017-05-03 NOTE — Progress Notes (Signed)
Patient here for breast cancer follow-up. Patient reports increase in abdominal weight. She denies any hot flashes. She reports that she is very active. She exercises - walks 3 miles 5 days a week and plays golf multiple times a week.  She asked Dr. Rogue Bussing to review her labs from pcp-elevated granulocyte level.  Her mammogram and dexa scan are both scheduled tomorrow.  She has an apt with her surgeon in July.  She has no medical complaints.

## 2017-05-03 NOTE — Assessment & Plan Note (Addendum)
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 Status post mastectomy. No role for any radiation. Low risk Oncotype- On Arimidex [recommend for 5 years]. Clinically no evidence of recurrence.  # Incidental slight increase in atypical lymphocytes/metamyelocytes- white count normal 4.9; hemoglobin platelets normal- suspect benign process. Repeat labs in 6 months.   # Weight Gain- Continue healthy diet and exercise.   # BMD- May 2017- Osteopenia- Ca+ Vit D; recommend BMD in 2 years.   # Patient follow-up with MD in 6 months/CBC. Due for screening mammogram tomorrow.   Cc: Dr.Aldridge.

## 2017-05-03 NOTE — Telephone Encounter (Signed)
Encounter opened in error by provider.

## 2017-05-03 NOTE — Telephone Encounter (Deleted)
Needs to be schedule for port flush every 8 weeks.

## 2017-05-03 NOTE — Progress Notes (Signed)
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  female stage I breast cancer status post mastectomy; currently on Arimidex is here for follow-up.  Patient's only complaint is mild weight gain. Denies any swelling in the legs. Denies any arthritic pain. Hot flashes are stable. They are tolerable.  Denies any unusual aches and pains.  Patient had abnormal "white count"- with PCP. Otherwise denies any weight loss or night sweats lumps or bumps. Good energy levels.   REVIEW OF SYSTEMS:  A complete 10 point review of system is done which is negative except mentioned above/history of present illness.   PAST MEDICAL HISTORY :  Past Medical History:  Diagnosis Date  . Breast cancer ([place])   . Hyperlipidemia   . PONV (postoperative nausea and vomiting)   . Vertigo     PAST SURGICAL HISTORY :   Past Surgical History:  Procedure Laterality Date  . ABDOMINAL HYSTERECTOMY    . BREAST BIOPSY Left 06/09/2016   left [place] core bx  . BREAST CYST ASPIRATION Left    neg  . MASTECTOMY W/ SENTINEL NODE BIOPSY Left 07/16/2016   Procedure: MASTECTOMY WITH SENTINEL LYMPH NODE BIOPSY;  Surgeon: Hubbard Robinson, MD;  Location: [facility] ORS;  Service: General;  Laterality: Left;   . PILONIDAL CYST / SINUS EXCISION    . TOE SURGERY      FAMILY HISTORY :   Family History  Problem Relation Age of Onset  . Cancer Mother 23       Liver    SOCIAL HISTORY:  Lives in [place].  Social History  Substance Use Topics  . Smoking status: Former Research scientist (life sciences)  . Smokeless tobacco: Never Used  . Alcohol use 0.0 oz/week     Comment: 1 Scotch Daily    ALLERGIES:  has No Known Allergies.  MEDICATIONS:  Current Outpatient Prescriptions  Medication Sig Dispense Refill  . anastrozole (ARIMIDEX) 1 MG tablet Take 1 tablet (1 mg total) by mouth daily. 90 tablet 3  . Calcium 600-200 MG-UNIT tablet Take 1 tablet by mouth 2 (two) times daily.    . Multiple Vitamin (MULTIVITAMIN) capsule Take 1 capsule by mouth daily.     Marland Kitchen triamterene-hydrochlorothiazide (DYAZIDE) 37.5-25 MG capsule Take 1 capsule by mouth daily.      No current facility-administered medications for this visit.     PHYSICAL EXAMINATION: ECOG PERFORMANCE STATUS: 0 - Asymptomatic  BP 117/75   Pulse 73   Temp 97.8 F (36.6 C) (Tympanic)   Resp 16   There were no vitals filed for this visit.  GENERAL: Well-nourished well-developed; Alert, no distress and comfortable.   Alone EYES: no pallor or icterus OROPHARYNX: no thrush or ulceration; good dentition  NECK: supple,  no masses felt LYMPH:  no palpable lymphadenopathy in the cervical, axillary or inguinal regions LUNGS: clear to auscultation and  No wheeze or crackles HEART/CVS: regular rate & rhythm and no murmurs; No lower extremity edema ABDOMEN:abdomen soft, non-tender and normal bowel sounds Musculoskeletal:no cyanosis of digits and no clubbing  PSYCH: alert & oriented x 3 with fluent speech NEURO: no focal motor/sensory deficits SKIN:  no rashes or significant lesions. Right BREAST exam [in the presence of nurse]- no unusual skin changes or dominant masses felt. Surgical scars noted. Left mastectomy incision noted well-healing.   LABORATORY DATA:  I  have reviewed the data as listed    Component Value Date/Time   NA 140 07/08/2016 1353   NA 142 09/11/2012 2242   K 3.6 07/08/2016 1353   K 3.8 09/11/2012 2242   CL 100 (L) 07/08/2016 1353   CL 104 09/11/2012 2242   CO2 33 (H) 07/08/2016 1353   CO2 29 09/11/2012 2242   GLUCOSE 100 (H) 07/08/2016 1353   GLUCOSE 94 09/11/2012 2242   BUN 32 (H) 07/08/2016 1353   BUN 27 (H) 09/11/2012 2242   CREATININE 0.83 07/08/2016 1353   CREATININE 0.80 09/11/2012 2242   CALCIUM 10.2 07/08/2016 1353   CALCIUM 9.7 09/11/2012 2242   PROT 7.5 09/11/2012 2242   ALBUMIN 4.4 09/11/2012 2242   AST 19 09/11/2012 2242   ALT 19 09/11/2012 2242   ALKPHOS 72 09/11/2012 2242   BILITOT 0.4 09/11/2012 2242   GFRNONAA >60 07/08/2016 1353   GFRNONAA >60 09/11/2012 2242   GFRAA >60 07/08/2016 1353   GFRAA >60 09/11/2012 2242    No results found for: SPEP, UPEP  Lab Results  Component Value Date   WBC 6.4 07/08/2016   NEUTROABS 4.3 07/08/2016   HGB 14.3 07/08/2016   HCT 41.1 07/08/2016   MCV 91.1 07/08/2016   PLT 249 07/08/2016      Chemistry      Component Value Date/Time   NA 140 07/08/2016 1353   NA 142 09/11/2012 2242   K 3.6 07/08/2016 1353   K 3.8 09/11/2012 2242   CL 100 (L) 07/08/2016 1353   CL 104 09/11/2012 2242   CO2 33 (H) 07/08/2016 1353   CO2 29 09/11/2012 2242   BUN 32 (H) 07/08/2016 1353   BUN 27 (H) 09/11/2012 2242   CREATININE 0.83 07/08/2016 1353   CREATININE 0.80 09/11/2012 2242      Component Value Date/Time   CALCIUM 10.2 07/08/2016 1353   CALCIUM 9.7 09/11/2012 2242   ALKPHOS 72 09/11/2012 2242   AST 19 09/11/2012 2242   ALT 19 09/11/2012 2242   BILITOT 0.4 09/11/2012 2242       RADIOGRAPHIC STUDIES: I have personally reviewed the radiological images as listed and agreed with the findings in the report. No results found.   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 Status post mastectomy. No role for any radiation. Low risk Oncotype- On Arimidex [recommend for 5 years]. Clinically no evidence of recurrence.  # Incidental slight increase in atypical lymphocytes/metamyelocytes- white count normal 4.9; hemoglobin platelets normal- suspect benign process. Repeat labs in 6 months.   # Weight Gain- Continue healthy diet and exercise.   # BMD- May 2017- Osteopenia- Ca+ Vit D; recommend BMD in 2 years.   # Patient follow-up with MD in 6 months/CBC. Due for screening mammogram tomorrow.  Cc: Dr.Aldridge.   Orders Placed This Encounter  Procedures  . CBC with Differential    Standing Status:   Future    Standing Expiration Date:   05/03/2018      Cammie Sickle, MD 05/03/2017 4:26 PM

## 2017-05-04 ENCOUNTER — Encounter: Payer: Self-pay | Admitting: *Deleted

## 2017-05-04 ENCOUNTER — Other Ambulatory Visit: Payer: Self-pay | Admitting: *Deleted

## 2017-05-04 ENCOUNTER — Ambulatory Visit
Admission: RE | Admit: 2017-05-04 | Discharge: 2017-05-04 | Disposition: A | Discharge status: Home / Self Care | Payer: Medicare Other | Source: Ambulatory Visit | Attending: General Surgery | Admitting: General Surgery

## 2017-05-04 DIAGNOSIS — Z1231 Encounter for screening mammogram for malignant neoplasm of breast: Secondary | ICD-10-CM | POA: Insufficient documentation

## 2017-05-05 ENCOUNTER — Telehealth: Payer: Self-pay

## 2017-05-05 NOTE — Telephone Encounter (Signed)
Patient notified of Negative Mammogram at this time.  Patient reminded of follow up appointment 07/15/17 with Dr.Woodham for annual breast exam. Patient verbalized understanding.

## 2017-05-06 ENCOUNTER — Ambulatory Visit: Payer: 59

## 2017-05-17 ENCOUNTER — Ambulatory Visit: Payer: 59 | Admitting: Internal Medicine

## 2017-07-14 ENCOUNTER — Ambulatory Visit: Payer: Self-pay | Admitting: General Surgery

## 2017-07-15 ENCOUNTER — Ambulatory Visit (INDEPENDENT_AMBULATORY_CARE_PROVIDER_SITE_OTHER): Payer: Medicare Other | Admitting: General Surgery

## 2017-07-15 ENCOUNTER — Telehealth: Payer: Self-pay

## 2017-07-15 ENCOUNTER — Encounter: Payer: Self-pay | Admitting: General Surgery

## 2017-07-15 VITALS — BP 125/76 | HR 67 | Temp 97.6°F | Ht 67.0 in | Wt 186.2 lb

## 2017-07-15 DIAGNOSIS — Z1231 Encounter for screening mammogram for malignant neoplasm of breast: Secondary | ICD-10-CM

## 2017-07-15 DIAGNOSIS — D0592 Unspecified type of carcinoma in situ of left breast: Secondary | ICD-10-CM

## 2017-07-15 NOTE — Patient Instructions (Addendum)
We will call you when your Mammogram and Follow-up Appointment afterwards with Dr. Adonis Huguenin are scheduled.    Breast Self-Awareness Breast self-awareness means:  Knowing how your breasts look.  Knowing how your breasts feel.  Checking your breasts every month for changes.  Telling your doctor if you notice a change in your breasts.  Breast self-awareness allows you to notice a breast problem early while it is still small. How to do a breast self-exam One way to learn what is normal for your breasts and to check for changes is to do a breast self-exam. To do a breast self-exam: Look for Changes  1. Take off all the clothes above your waist. 2. Stand in front of a mirror in a room with good lighting. 3. Put your hands on your hips. 4. Push your hands down. 5. Look at your breasts and nipples in the mirror to see if one breast or nipple looks different than the other. Check to see if: ? The shape of one breast is different. ? The size of one breast is different. ? There are wrinkles, dips, and bumps in one breast and not the other. 6. Look at each breast for changes in your skin, such as: ? Redness. ? Scaly areas. 7. Look for changes in your nipples, such as: ? Liquid around the nipples. ? Bleeding. ? Dimpling. ? Redness. ? A change in where the nipples are. Feel for Changes 1. Lie on your back on the floor. 2. Feel each breast. To do this, follow these steps: ? Pick a breast to feel. ? Put the arm closest to that breast above your head. ? Use your other arm to feel the nipple area of your breast. Feel the area with the pads of your three middle fingers by making small circles with your fingers. For the first circle, press lightly. For the second circle, press harder. For the third circle, press even harder. ? Keep making circles with your fingers at the light, harder, and even harder pressures as you move down your breast. Stop when you feel your ribs. ? Move your fingers a  little toward the center of your body. ? Start making circles with your fingers again, this time going up until you reach your collarbone. ? Keep making up and down circles until you reach your armpit. Remember to keep using the three pressures. ? Feel the other breast in the same way. 3. Sit or stand in the shower or tub. 4. With soapy water on your skin, feel each breast the same way you did in step 2, when you were lying on the floor. Write Down What You Find  After doing the self-exam, write down:  What is normal for each breast.  Any changes you find in each breast.  When you last had your period.  How often should I check my breasts? Check your breasts every month. If you are breastfeeding, the best time to check them is after you feed your baby or after you use a breast pump. If you get periods, the best time to check your breasts is 5-7 days after your period is over. When should I see my doctor? See your doctor if you notice:  A change in shape or size of your breasts or nipples.  A change in the skin of your breast or nipples, such as red or scaly skin.  Unusual fluid coming from your nipples.  A lump or thick area that was not there before.  Pain  in your breasts.  Anything that concerns you.  This information is not intended to replace advice given to you by your health care provider. Make sure you discuss any questions you have with your health care provider. Document Released: 05/24/2008 Document Revised: 05/13/2016 Document Reviewed: 10/26/2015 Elsevier Interactive Patient Education  [employer].

## 2017-07-15 NOTE — Progress Notes (Signed)
Outpatient Surgical Follow Up  07/15/2017  Melanie Simmons is an 76 y.o. female.   Chief Complaint  Patient presents with  . Follow-up    Left Breast Mastectomy-Dr.Loflin-07/16/17    HPI: 76 year old female returns to clinic 1 year status post left mastectomy for breast cancer. Reports doing very well. She has been performing self exams and denies any new findings. She recently had a right-sided mammogram which showed no evidence of disease. She denies any fevers, chills, nausea, vomiting, chest pain, shortness of breath, diarrhea, constipation. She has no active complaints and she is here for follow-up exam.  Past Medical History:  Diagnosis Date  . Breast cancer ([place])   . Hyperlipidemia   . PONV (postoperative nausea and vomiting)   . Vertigo     Past Surgical History:  Procedure Laterality Date  . ABDOMINAL HYSTERECTOMY    . BREAST BIOPSY Left 06/09/2016   left [place] core bx positvie  . BREAST CYST ASPIRATION Left    neg  . MASTECTOMY Left 07/17/2016  . MASTECTOMY W/ SENTINEL NODE BIOPSY Left 07/16/2016   Procedure: MASTECTOMY WITH SENTINEL LYMPH NODE BIOPSY;  Surgeon: Hubbard Robinson, MD;  Location: [facility] ORS;  Service: General;  Laterality: Left;  . PILONIDAL CYST / SINUS EXCISION    . TOE SURGERY      Family History  Problem Relation Age of Onset  . Cancer Mother 38       Liver  . Breast cancer Neg Hx     Social History:  reports that she has quit smoking. She has never used smokeless tobacco. She reports that she drinks alcohol. She reports that she does not use drugs.  Allergies: No Known Allergies  Medications reviewed.    ROS A multipoint review of systems was completed, all pertinent positives and negatives are documented within the history of present illness and remainder are negative   BP 125/76   Pulse 67   Temp 97.6 F (36.4 C) (Oral)   Ht 5\' 7"  (1.702 m)   Wt 84.5 kg (186 lb 3.2 oz)   BMI 29.16 kg/m   Physical Exam Gen.: No acute  distress Breast: Left breast is surgically absent. Scars well-healed without any evidence of palpable masses or areas of concern. Right breast palpates normal without any palpable masses on exam. Chest: Clear to auscultation Heart: Regular rhythm Abdomen: Soft and nontender    No results found for this or any previous visit (from the past 48 hour(s)). No results found.  Assessment/Plan:  1. Carcinoma in situ of left breast, unspecified type 76 year old female one year status post left mastectomy. Doing very well. Discussed continuing surveillance and repeat exams. She voiced understanding and will follow-up again in clinic after her next set of images are obtained or sooner should she have any concerning findings on self-exam. - MM DIAG BREAST TOMO UNI RIGHT; Future - US BREAST LTD UNI RIGHT INC AXILLA; Future  A total of 15 minutes was used on this encounter greater than 50% of it used for counseling and coordination of care.   Clayburn Pert, MD FACS General Surgeon  07/15/2017,1:07 PM

## 2017-07-15 NOTE — Telephone Encounter (Signed)
Screening Mammogram scheduled at 10am on 05/05/18 at [facility]. Patient has been made aware of appointment.  Will call patient to schedule follow-up with Dr. Adonis Huguenin in April.

## 2017-08-31 NOTE — Discharge Instructions (Signed)
INSTRUCTIONS FOLLOWING OCULOPLASTIC SURGERY °AMY M. FOWLER, MD ° °AFTER YOUR EYE SURGERY, THER ARE MANY THINGS THWIHC YOU, THE PATIENT, CAN DO TO ASSURE THE BEST POSSIBLE RESULT FROM YOUR OPERATION.  THIS SHEET SHOULD BE REFERRED TO WHENEVER QUESTIONS ARISE.  IF THERE ARE ANY QUESTIONS NOT ANSWERED HERE, DO NOT HESITATE TO CALL OUR OFFICE AT [phone] OR [phone].  THERE IS ALWAYS [facility] AVAILABLE TO CALL IF QUESTIONS OR PROBLEMS ARISE. ° °VISION: Your vision may be blurred and out of focus after surgery until you are able to stop using your ointment, swelling resolves and your eye(s) heal. This may take 1 to 2 weeks at the least.  If your vision becomes gradually more dim or dark, this is not normal and you need to call our office immediately. ° °EYE CARE: For the first 48 hours after surgery, use ice packs frequently - “20 minutes on, 20 minutes off” - to help reduce swelling and bruising.  Small bags of frozen peas or corn make good ice packs along with cloths soaked in ice water.  If you are wearing a patch or other type of dressing following surgery, keep this on for the amount of time specified by your doctor.  For the first week following surgery, you will need to treat your stitches with great care.  If is OK to shower, but take care to not allow soapy water to run into your eye(s) to help reduce changes of infection.  You may gently clean the eyelashes and around the eye(s) with cotton balls and sterile water, BUT DO NOT RUB THE STITCHES VIGOROUSLY.  Keeping your stitches moist with ointment will help promote healing with minimal scar formation. ° °ACTIVITY: When you leave the surgery center, you should go home, rest and be inactive.  The eye(s) may feel scratchy and keeping the eyes closed will allow for faster healing.  The first week following surgery, avoid straining (anything making the face turn red) or lifting over 20 pounds.  Additionally, avoid bending which causes your head to go below  your waist.  Using your eyes will NOT harm them, so feel free to read, watch television, use the computer, etc as desired.  Driving depends on each individual, so check with your doctor if you have questions about driving. ° °MEDICATIONS:  You will be given a prescription for an ointment to use 4 times a day on your stitches.  You can use the ointment in your eyes if they feel scratchy or irritated.  If you eyelid(s) don’t close completely when you sleep, put some ointment in your eyes before bedtime. ° °EMERGENCY: If you experience SEVERE EYE PAIN OR HEADACHE UNRELIEVED BY TYLENOL OR PERCOCET, NAUSEA OR VOMITING, WORSENING REDNESS, OR WORSENING VISION (ESPECIALLY VISION THAT WA INITIALLY BETTER) CALL [phone] OR [phone] DURING BUSINESS HOURS OR AFTER HOURS. ° °General Anesthesia, Adult, Care After °These instructions provide you with information about caring for yourself after your procedure. Your health care provider may also give you more specific instructions. Your treatment has been planned according to current medical practices, but problems sometimes occur. Call your health care provider if you have any problems or questions after your procedure. °What can I expect after the procedure? °After the procedure, it is common to have: °· Vomiting. °· A sore throat. °· Mental slowness. ° °It is common to feel: °· Nauseous. °· Cold or shivery. °· Sleepy. °· Tired. °· Sore or achy, even in parts of your body where you did not have surgery. ° °  Follow these instructions at home: °For at least 24 hours after the procedure: °· Do not: °? Participate in activities where you could fall or become injured. °? Drive. °? Use heavy machinery. °? Drink alcohol. °? Take sleeping pills or medicines that cause drowsiness. °? Make important decisions or sign legal documents. °? Take care of children on your own. °· Rest. °Eating and drinking °· If you vomit, drink water, juice, or soup when you can drink without  vomiting. °· Drink enough fluid to keep your urine clear or pale yellow. °· Make sure you have little or no nausea before eating solid foods. °· Follow the diet recommended by your health care provider. °General instructions °· Have a responsible adult stay with you until you are awake and alert. °· Return to your normal activities as told by your health care provider. Ask your health care provider what activities are safe for you. °· Take over-the-counter and prescription medicines only as told by your health care provider. °· If you smoke, do not smoke without supervision. °· Keep all follow-up visits as told by your health care provider. This is important. °Contact a health care provider if: °· You continue to have nausea or vomiting at home, and medicines are not helpful. °· You cannot drink fluids or start eating again. °· You cannot urinate after 8-12 hours. °· You develop a skin rash. °· You have fever. °· You have increasing redness at the site of your procedure. °Get help right away if: °· You have difficulty breathing. °· You have chest pain. °· You have unexpected bleeding. °· You feel that you are having a life-threatening or urgent problem. °This information is not intended to replace advice given to you by your health care provider. Make sure you discuss any questions you have with your health care provider. °Document Released: 03/14/2001 Document Revised: 05/10/2016 Document Reviewed: 11/20/2015 °Elsevier Interactive Patient Education © 2018 [employer] Inc[employer] ° °

## 2017-09-01 ENCOUNTER — Encounter: Payer: Self-pay | Admitting: *Deleted

## 2017-09-06 ENCOUNTER — Ambulatory Visit
Admission: RE | Admit: 2017-09-06 | Discharge: 2017-09-06 | Disposition: A | Discharge status: Home / Self Care | Payer: Medicare Other | Source: Ambulatory Visit | Attending: Ophthalmology | Admitting: Ophthalmology

## 2017-09-06 ENCOUNTER — Ambulatory Visit: Payer: Medicare Other | Admitting: Anesthesiology

## 2017-09-06 ENCOUNTER — Encounter
Admission: RE | Disposition: A | Discharge status: Home / Self Care | Payer: Self-pay | Source: Ambulatory Visit | Attending: Ophthalmology

## 2017-09-06 DIAGNOSIS — H02831 Dermatochalasis of right upper eyelid: Secondary | ICD-10-CM | POA: Diagnosis not present

## 2017-09-06 DIAGNOSIS — Z79899 Other long term (current) drug therapy: Secondary | ICD-10-CM | POA: Insufficient documentation

## 2017-09-06 DIAGNOSIS — Z79811 Long term (current) use of aromatase inhibitors: Secondary | ICD-10-CM | POA: Insufficient documentation

## 2017-09-06 DIAGNOSIS — Z87891 Personal history of nicotine dependence: Secondary | ICD-10-CM | POA: Diagnosis not present

## 2017-09-06 DIAGNOSIS — H02834 Dermatochalasis of left upper eyelid: Secondary | ICD-10-CM | POA: Diagnosis present

## 2017-09-06 HISTORY — DX: Motion sickness, initial encounter: T75.3XXA

## 2017-09-06 HISTORY — PX: BROW LIFT: SHX178

## 2017-09-06 SURGERY — BLEPHAROPLASTY
Anesthesia: General | Laterality: Bilateral | Wound class: Clean

## 2017-09-06 MED ORDER — OXYCODONE-ACETAMINOPHEN 5-325 MG PO TABS: 1.0000 | ORAL_TABLET | ORAL | 0 refills | Status: DC | PRN

## 2017-09-06 MED ORDER — MIDAZOLAM HCL 2 MG/2ML IJ SOLN
INTRAMUSCULAR | Status: DC | PRN
  Administered 2017-09-06 (×2): 1 mg via INTRAVENOUS

## 2017-09-06 MED ORDER — FENTANYL CITRATE (PF) 100 MCG/2ML IJ SOLN: 25.0000 ug | INTRAMUSCULAR | Status: DC | PRN

## 2017-09-06 MED ORDER — OXYCODONE HCL 5 MG/5ML PO SOLN: 5.0000 mg | Freq: Once | ORAL | Status: DC | PRN

## 2017-09-06 MED ORDER — ERYTHROMYCIN 5 MG/GM OP OINT
TOPICAL_OINTMENT | OPHTHALMIC | Status: DC | PRN
  Administered 2017-09-06: 1

## 2017-09-06 MED ORDER — OXYCODONE HCL 5 MG PO TABS: 5.0000 mg | ORAL_TABLET | Freq: Once | ORAL | Status: DC | PRN

## 2017-09-06 MED ORDER — ERYTHROMYCIN 5 MG/GM OP OINT: TOPICAL_OINTMENT | OPHTHALMIC | 3 refills | Status: DC

## 2017-09-06 MED ORDER — MEPERIDINE HCL 25 MG/ML IJ SOLN: 6.2500 mg | INTRAMUSCULAR | Status: DC | PRN

## 2017-09-06 MED ORDER — LIDOCAINE-EPINEPHRINE 2 %-1:100000 IJ SOLN
INTRAMUSCULAR | Status: DC | PRN
  Administered 2017-09-06: 4 mL via OPHTHALMIC

## 2017-09-06 MED ORDER — LACTATED RINGERS IV SOLN
INTRAVENOUS | Status: DC
  Administered 2017-09-06: 08:00:00 via INTRAVENOUS

## 2017-09-06 MED ORDER — BSS IO SOLN
INTRAOCULAR | Status: DC | PRN
  Administered 2017-09-06: 15 mL via INTRAOCULAR

## 2017-09-06 MED ORDER — TETRACAINE HCL 0.5 % OP SOLN
OPHTHALMIC | Status: DC | PRN
  Administered 2017-09-06: 2 [drp] via OPHTHALMIC

## 2017-09-06 MED ORDER — PROPOFOL 500 MG/50ML IV EMUL
INTRAVENOUS | Status: DC | PRN
  Administered 2017-09-06: 25 ug/kg/min via INTRAVENOUS

## 2017-09-06 MED ORDER — ALFENTANIL 500 MCG/ML IJ INJ
INJECTION | INTRAVENOUS | Status: DC | PRN
  Administered 2017-09-06 (×2): 250 ug via INTRAVENOUS
  Administered 2017-09-06: 500 ug via INTRAVENOUS

## 2017-09-06 MED ORDER — LACTATED RINGERS IV SOLN
10.0000 mL/h | INTRAVENOUS | Status: DC
  Administered 2017-09-06: 10 mL/h via INTRAVENOUS

## 2017-09-06 SURGICAL SUPPLY — 35 items
APPLICATOR COTTON TIP WD 3 STR (MISCELLANEOUS) ×3 IMPLANT
BLADE SURG 15 STRL LF DISP TIS (BLADE) ×1 IMPLANT
BLADE SURG 15 STRL SS (BLADE) ×2
CORD BIP STRL DISP 12FT (MISCELLANEOUS) ×3 IMPLANT
DRAPE HEAD BAR (DRAPES) ×3 IMPLANT
GAUZE SPONGE 4X4 12PLY STRL (GAUZE/BANDAGES/DRESSINGS) ×3 IMPLANT
GAUZE SPONGE NON-WVN 2X2 STRL (MISCELLANEOUS) ×10 IMPLANT
GLOVE SURG LX 7.0 MICRO (GLOVE) ×4
GLOVE SURG LX STRL 7.0 MICRO (GLOVE) ×2 IMPLANT
MARKER SKIN XFINE TIP W/RULER (MISCELLANEOUS) ×3 IMPLANT
NEEDLE FILTER BLUNT 18X 1/2SAF (NEEDLE) ×2
NEEDLE FILTER BLUNT 18X1 1/2 (NEEDLE) ×1 IMPLANT
NEEDLE HYPO 30X.5 LL (NEEDLE) ×6 IMPLANT
PACK DRAPE NASAL/ENT (PACKS) ×3 IMPLANT
SOL PREP PVP 2OZ (MISCELLANEOUS) ×3
SOLUTION PREP PVP 2OZ (MISCELLANEOUS) ×1 IMPLANT
SPONGE VERSALON 2X2 STRL (MISCELLANEOUS) ×20
SUT CHROMIC 4-0 (SUTURE)
SUT CHROMIC 4-0 M2 12X2 ARM (SUTURE)
SUT CHROMIC 5 0 P 3 (SUTURE) IMPLANT
SUT ETHILON 4 0 CL P 3 (SUTURE) IMPLANT
SUT MERSILENE 4-0 S-2 (SUTURE) IMPLANT
SUT PLAIN GUT (SUTURE) ×3 IMPLANT
SUT PROLENE 5 0 P 3 (SUTURE) IMPLANT
SUT PROLENE 6 0 P 1 18 (SUTURE) IMPLANT
SUT SILK 4 0 G 3 (SUTURE) IMPLANT
SUT VIC AB 5-0 P-3 18X BRD (SUTURE) IMPLANT
SUT VIC AB 5-0 P3 18 (SUTURE)
SUT VICRYL 6-0  S14 CTD (SUTURE)
SUT VICRYL 6-0 S14 CTD (SUTURE) IMPLANT
SUT VICRYL 7 0 TG140 8 (SUTURE) IMPLANT
SUTURE CHRMC 4-0 M2 12X2 ARM (SUTURE) IMPLANT
SYR 3ML LL SCALE MARK (SYRINGE) ×3 IMPLANT
SYRINGE 10CC LL (SYRINGE) ×3 IMPLANT
WATER STERILE IRR 250ML POUR (IV SOLUTION) ×3 IMPLANT

## 2017-09-06 NOTE — Interval H&P Note (Signed)
History and Physical Interval Note:  09/06/2017 7:31 AM  Melanie Simmons  has presented today for surgery, with the diagnosis of H02.831 H02.834  Dermatochalasis  The various methods of treatment have been discussed with the patient and family. After consideration of risks, benefits and other options for treatment, the patient has consented to  Procedure(s) with comments: BLEPHAROPLASTY (Bilateral) - MAC as a surgical intervention .  The patient's history has been reviewed, patient examined, no change in status, stable for surgery.  I have reviewed the patient's chart and labs.  Questions were answered to the patient's satisfaction.     Vickki Muff, Amy M

## 2017-09-06 NOTE — Transfer of Care (Signed)
Immediate Anesthesia Transfer of Care Note  Patient: Melanie Simmons  Procedure(s) Performed: Procedure(s) with comments: BLEPHAROPLASTY (Bilateral) - MAC  Patient Location: PACU  Anesthesia Type: General  Level of Consciousness: awake, alert  and patient cooperative  Airway and Oxygen Therapy: Patient Spontanous Breathing and Patient connected to supplemental oxygen  Post-op Assessment: Post-op Vital signs reviewed, Patient's Cardiovascular Status Stable, Respiratory Function Stable, Patent Airway and No signs of Nausea or vomiting  Post-op Vital Signs: Reviewed and stable  Complications: No apparent anesthesia complications

## 2017-09-06 NOTE — Anesthesia Preprocedure Evaluation (Signed)
Anesthesia Evaluation  Patient identified by MRN, date of birth, ID band Patient awake    Reviewed: Allergy & Precautions, NPO status , Patient's Chart, lab work & pertinent test results  History of Anesthesia Complications (+) PONV and history of anesthetic complications  Airway Mallampati: II  TM Distance: >3 FB     Dental  (+) Caps   Pulmonary neg pulmonary ROS, former smoker,    Pulmonary exam normal        Cardiovascular negative cardio ROS Normal cardiovascular exam     Neuro/Psych Vertigo and inner ear dysfunction    GI/Hepatic Neg liver ROS,   Endo/Other  negative endocrine ROS  Renal/GU negative Renal ROS  negative genitourinary   Musculoskeletal negative musculoskeletal ROS (+)   Abdominal Normal abdominal exam  (+)   Peds negative pediatric ROS (+)  Hematology negative hematology ROS (+)   Anesthesia Other Findings   Reproductive/Obstetrics                             Anesthesia Physical  Anesthesia Plan  ASA: II  Anesthesia Plan: General   Post-op Pain Management:    Induction: Intravenous  PONV Risk Score and Plan: Dexamethasone, Ondansetron and Propofol infusion  Airway Management Planned: Oral ETT  Additional Equipment:   Intra-op Plan:   Post-operative Plan: Extubation in OR  Informed Consent: I have reviewed the patients History and Physical, chart, labs and discussed the procedure including the risks, benefits and alternatives for the proposed anesthesia with the patient or authorized representative who has indicated his/her understanding and acceptance.   Dental advisory given  Plan Discussed with: CRNA and Surgeon  Anesthesia Plan Comments:         Anesthesia Quick Evaluation

## 2017-09-06 NOTE — Op Note (Signed)
Preoperative Diagnosis:  Visually significant dermatochalasis bilateral Upper Eyelid(s)  Postoperative Diagnosis:  Same.  Procedure(s) Performed:   Upper eyelid blepharoplasty with excess skin excision  bilateral Upper Eyelid(s)  Teaching Surgeon: Philis Pique. Vickki Muff, M.D.  Assistants: none  Anesthesia: MAC  Specimens: None.  Estimated Blood Loss: Minimal.  Complications: None.  Operative Findings: None Dictated  Procedure:   Allergies were reviewed and the patient is allergic to Patient has no known allergies..   After the risks, benefits, complications and alternatives were discussed with the patient, appropriate informed consent was obtained and the patient was brought to the operating suite. The patient was reclined supine and a timeout was conducted.  The patient was then sedated.  Local anesthetic consisting of a 50-50 mixture of 2% lidocaine with epinephrine and 0.75% bupivacaine with added Hylenex was injected subcutaneously to both upper eyelid(s). After adequate local was instilled, the patient was prepped and draped in the usual sterile fashion for eyelid surgery.   Attention was turned to the upper eyelids. A 72m upper eyelid crease incision line was marked with calipers on both upper eyelid(s).  A pinch test was used to estimate the amount of excess skin to remove and this was marked in standard blepharoplasty style fashion. Attention was turned to the  right upper eyelid. A #15 blade was used to open the premarked incision line. A skin and muscle flap was excised and hemostasis was obtained with bipolar cautery.   A buttonhole was created medially in orbicularis and orbital septum to reveal the medial fat pocket. This was dissected free from fascial attachments, cauterized towards the pedicle base and excised to produce a nice flattening of the medial corner of the upper eyelid.  Attention was then turned to the opposite eyelid where the same procedure was performed in  the same manner. Hemostasis was obtained with bipolar cautery throughout. All incisions were then closed with a combination of running and interrupted 6-0 fast absorbing plain suture. The patient tolerated the procedure well.  Erythromycin Ophthalmic ointment was applied to her incision sites, followed by ice packs. She was taken to the recovery area where she recovered without difficulty.  Post-Op Plan/Instructions:  The patient was instructed to use ice packs frequently for the next 48 hours. She was instructed to use erythromycin ophthalmic ointment on her incisions 4 times a day for the next 12 to 14 days. She was given a prescription for Percocet for pain control should Tylenol not be effective. [facility] [place] needed for problems.  Dontarious Schaum M. FVickki Muff M.D. Attending,Ophthalmology

## 2017-09-06 NOTE — Anesthesia Procedure Notes (Signed)
Procedure Name: MAC Date/Time: 09/06/2017 7:40 AM Performed by: Cameron Ali Pre-anesthesia Checklist: Patient identified, Emergency Drugs available, Suction available, Timeout performed and Patient being monitored Patient Re-evaluated:Patient Re-evaluated prior to induction Oxygen Delivery Method: Nasal cannula Placement Confirmation: positive ETCO2

## 2017-09-06 NOTE — Anesthesia Postprocedure Evaluation (Signed)
Anesthesia Post Note  Patient: MEKAYLA SOMAN  Procedure(s) Performed: Procedure(s) (LRB): BLEPHAROPLASTY (Bilateral)  Patient location during evaluation: PACU Anesthesia Type: General Level of consciousness: awake and alert Pain management: pain level controlled Vital Signs Assessment: post-procedure vital signs reviewed and stable Respiratory status: spontaneous breathing, nonlabored ventilation, respiratory function stable and patient connected to nasal cannula oxygen Cardiovascular status: blood pressure returned to baseline and stable Postop Assessment: no apparent nausea or vomiting Anesthetic complications: no    SCOURAS, NICOLE ELAINE

## 2017-09-06 NOTE — H&P (Signed)
See the history and physical completed at [facility] on 08/24/17 and scanned into the chart.

## 2017-09-07 ENCOUNTER — Encounter: Payer: Self-pay | Admitting: Ophthalmology

## 2017-09-14 ENCOUNTER — Other Ambulatory Visit: Payer: Self-pay | Admitting: Internal Medicine

## 2017-09-14 DIAGNOSIS — C50512 Malignant neoplasm of lower-outer quadrant of left female breast: Secondary | ICD-10-CM

## 2017-11-01 ENCOUNTER — Ambulatory Visit: Payer: 59 | Admitting: Internal Medicine

## 2017-11-01 ENCOUNTER — Other Ambulatory Visit: Payer: 59

## 2017-11-15 ENCOUNTER — Inpatient Hospital Stay: Payer: Medicare Other

## 2017-11-15 ENCOUNTER — Encounter: Payer: Self-pay | Admitting: Internal Medicine

## 2017-11-15 ENCOUNTER — Other Ambulatory Visit: Payer: Self-pay

## 2017-11-15 ENCOUNTER — Inpatient Hospital Stay: Payer: Medicare Other | Attending: Internal Medicine | Admitting: Internal Medicine

## 2017-11-15 VITALS — BP 108/67 | HR 75 | Temp 98.9°F | Resp 18

## 2017-11-15 DIAGNOSIS — Z79899 Other long term (current) drug therapy: Secondary | ICD-10-CM | POA: Diagnosis not present

## 2017-11-15 DIAGNOSIS — C50512 Malignant neoplasm of lower-outer quadrant of left female breast: Secondary | ICD-10-CM

## 2017-11-15 DIAGNOSIS — Z170 Estrogen receptor positive status [ER+]: Secondary | ICD-10-CM | POA: Insufficient documentation

## 2017-11-15 DIAGNOSIS — Z87891 Personal history of nicotine dependence: Secondary | ICD-10-CM

## 2017-11-15 DIAGNOSIS — M8580 Other specified disorders of bone density and structure, unspecified site: Secondary | ICD-10-CM

## 2017-11-15 DIAGNOSIS — Z79811 Long term (current) use of aromatase inhibitors: Secondary | ICD-10-CM | POA: Diagnosis not present

## 2017-11-15 DIAGNOSIS — Z9012 Acquired absence of left breast and nipple: Secondary | ICD-10-CM

## 2017-11-15 DIAGNOSIS — E785 Hyperlipidemia, unspecified: Secondary | ICD-10-CM | POA: Diagnosis not present

## 2017-11-15 LAB — CBC WITH DIFFERENTIAL/PLATELET
BASOS ABS: 0.1 10*3/uL (ref 0–0.1)
BASOS PCT: 1 %
EOS ABS: 0.1 10*3/uL (ref 0–0.7)
Eosinophils Relative: 2 %
HCT: 41.6 % (ref 35.0–47.0)
HEMOGLOBIN: 14.2 g/dL (ref 12.0–16.0)
Lymphocytes Relative: 21 %
Lymphs Abs: 1.4 10*3/uL (ref 1.0–3.6)
MCH: 30.8 pg (ref 26.0–34.0)
MCHC: 34.1 g/dL (ref 32.0–36.0)
MCV: 90.4 fL (ref 80.0–100.0)
MONOS PCT: 8 %
Monocytes Absolute: 0.5 10*3/uL (ref 0.2–0.9)
NEUTROS PCT: 68 %
Neutro Abs: 4.5 10*3/uL (ref 1.4–6.5)
Platelets: 270 10*3/uL (ref 150–440)
RBC: 4.6 MIL/uL (ref 3.80–5.20)
RDW: 13.8 % (ref 11.5–14.5)
WBC: 6.7 10*3/uL (ref 3.6–11.0)

## 2017-11-15 NOTE — Assessment & Plan Note (Addendum)
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 Status post mastectomy. No role for any radiation. Low risk Oncotype. On Arimidex   # Clinically no evidence of recurrence. Await mammogram- may 2019.   # BMD- May 2017- Osteopenia- Ca+ Vit D. BMD prior-in 6 months.  # Patient follow-up with MD in 6 months; mammo- right breast/BMD- May 2018. No labs.    Cc: Dr.Aldridge.

## 2017-11-15 NOTE — Progress Notes (Signed)
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  female stage I ER/PR positive HER-2/neu negative breast cancer status post mastectomy; currently on Arimidex is here for follow-up.  Patient denies any unusual body aches or chest pains or shortness of breath or cough. She continues to be fairly active. No bone pain.  REVIEW OF SYSTEMS:  A complete 10 point review of system is done which is negative except mentioned above/history of present illness.   PAST MEDICAL HISTORY :  Past Medical History:  Diagnosis Date  . Breast cancer ([place])   . Hyperlipidemia   . Motion sickness    ships  . PONV (postoperative nausea and vomiting)   . Vertigo     PAST SURGICAL HISTORY :   Past Surgical History:  Procedure Laterality Date  . ABDOMINAL HYSTERECTOMY    . BREAST BIOPSY Left 06/09/2016   left [place] core bx positvie  . BREAST CYST ASPIRATION Left    neg  . BROW LIFT Bilateral 09/06/2017   Procedure: BLEPHAROPLASTY;  Surgeon: Karle Starch, MD;  Location: [place];  Service: Ophthalmology;  Laterality: Bilateral;  MAC  . MASTECTOMY Left 07/17/2016  . MASTECTOMY W/ SENTINEL NODE BIOPSY Left 07/16/2016   Procedure: MASTECTOMY WITH SENTINEL LYMPH NODE  BIOPSY;  Surgeon: Hubbard Robinson, MD;  Location: [facility] ORS;  Service: General;  Laterality: Left;  . PILONIDAL CYST / SINUS EXCISION    . TOE SURGERY      FAMILY HISTORY :   Family History  Problem Relation Age of Onset  . Cancer Mother 51       Liver  . Breast cancer Neg Hx     SOCIAL HISTORY:  Lives in [place].  Social History   Tobacco Use  . Smoking status: Former Research scientist (life sciences)  . Smokeless tobacco: Never Used  . Tobacco comment: while in early 20s  Substance Use Topics  . Alcohol use: Yes    Alcohol/week: 0.0 oz    Comment: 1 Scotch Daily  . Drug use: No    ALLERGIES:  has No Known Allergies.  MEDICATIONS:  Current Outpatient Medications  Medication Sig Dispense Refill  . anastrozole (ARIMIDEX) 1 MG tablet Take 1 tablet by mouth every day 90 tablet 2  . Calcium 600-200 MG-UNIT tablet Take 1 tablet by mouth 2 (two) times daily.    . Multiple Vitamin (MULTIVITAMIN) capsule Take 1 capsule by mouth daily.     Marland Kitchen triamterene-hydrochlorothiazide (DYAZIDE) 37.5-25 MG capsule Take 1 capsule by mouth daily.      No current facility-administered medications for this visit.     PHYSICAL EXAMINATION: ECOG PERFORMANCE STATUS: 0 - Asymptomatic  BP 108/67   Pulse 75   Temp 98.9 F (  37.2 C) (Tympanic)   Resp 18   There were no vitals filed for this visit.  GENERAL: Well-nourished well-developed; Alert, no distress and comfortable.   Alone EYES: no pallor or icterus OROPHARYNX: no thrush or ulceration; good dentition  NECK: supple, no masses felt LYMPH:  no palpable lymphadenopathy in the cervical, axillary or inguinal regions LUNGS: clear to auscultation and  No wheeze or crackles HEART/CVS: regular rate & rhythm and no murmurs; No lower extremity edema ABDOMEN:abdomen soft, non-tender and normal bowel sounds Musculoskeletal:no cyanosis of digits and no clubbing  PSYCH: alert & oriented x 3 with fluent speech NEURO: no focal motor/sensory deficits SKIN:  no rashes or  significant lesions.    LABORATORY DATA:  I have reviewed the data as listed    Component Value Date/Time   NA 140 07/08/2016 1353   NA 142 09/11/2012 2242   K 3.6 07/08/2016 1353   K 3.8 09/11/2012 2242   CL 100 (L) 07/08/2016 1353   CL 104 09/11/2012 2242   CO2 33 (H) 07/08/2016 1353   CO2 29 09/11/2012 2242   GLUCOSE 100 (H) 07/08/2016 1353   GLUCOSE 94 09/11/2012 2242   BUN 32 (H) 07/08/2016 1353   BUN 27 (H) 09/11/2012 2242   CREATININE 0.83 07/08/2016 1353   CREATININE 0.80 09/11/2012 2242   CALCIUM 10.2 07/08/2016 1353   CALCIUM 9.7 09/11/2012 2242   PROT 7.5 09/11/2012 2242   ALBUMIN 4.4 09/11/2012 2242   AST 19 09/11/2012 2242   ALT 19 09/11/2012 2242   ALKPHOS 72 09/11/2012 2242   BILITOT 0.4 09/11/2012 2242   GFRNONAA >60 07/08/2016 1353   GFRNONAA >60 09/11/2012 2242   GFRAA >60 07/08/2016 1353   GFRAA >60 09/11/2012 2242    No results found for: SPEP, UPEP  Lab Results  Component Value Date   WBC 6.7 11/15/2017   NEUTROABS 4.5 11/15/2017   HGB 14.2 11/15/2017   HCT 41.6 11/15/2017   MCV 90.4 11/15/2017   PLT 270 11/15/2017      Chemistry      Component Value Date/Time   NA 140 07/08/2016 1353   NA 142 09/11/2012 2242   K 3.6 07/08/2016 1353   K 3.8 09/11/2012 2242   CL 100 (L) 07/08/2016 1353   CL 104 09/11/2012 2242   CO2 33 (H) 07/08/2016 1353   CO2 29 09/11/2012 2242   BUN 32 (H) 07/08/2016 1353   BUN 27 (H) 09/11/2012 2242   CREATININE 0.83 07/08/2016 1353   CREATININE 0.80 09/11/2012 2242      Component Value Date/Time   CALCIUM 10.2 07/08/2016 1353   CALCIUM 9.7 09/11/2012 2242   ALKPHOS 72 09/11/2012 2242   AST 19 09/11/2012 2242   ALT 19 09/11/2012 2242   BILITOT 0.4 09/11/2012 2242       RADIOGRAPHIC STUDIES: I have personally reviewed the radiological images as listed and agreed with the findings in the report. No results found.   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 Status post mastectomy. No role for any radiation. Low risk Oncotype. On Arimidex   # Clinically no evidence of recurrence. Await mammogram- may 2019.   # BMD- May 2017- Osteopenia- Ca+ Vit D. BMD prior-in 6 months.  # Patient follow-up with MD in 6 months; mammo- right breast/BMD- May 2018. No labs.    Cc: Dr.Aldridge.   Orders Placed This Encounter  Procedures  . DG Bone Density    Standing Status:   Future    Standing Expiration Date:   11/15/2018    Order Specific Question:   Reason for Exam (SYMPTOM  OR DIAGNOSIS REQUIRED)    Answer:   aromatase inhibiter use; h/o breast cancer    Order Specific Question:   Preferred imaging location?    Answer:   [facility]      Cammie Sickle, MD 11/15/2017 6:33 PM

## 2018-04-13 ENCOUNTER — Other Ambulatory Visit: Payer: Self-pay | Admitting: Internal Medicine

## 2018-04-13 DIAGNOSIS — C50512 Malignant neoplasm of lower-outer quadrant of left female breast: Secondary | ICD-10-CM

## 2018-05-08 ENCOUNTER — Ambulatory Visit
Admission: RE | Admit: 2018-05-08 | Discharge: 2018-05-08 | Disposition: A | Discharge status: Home / Self Care | Payer: Medicare Other | Source: Ambulatory Visit | Attending: Internal Medicine | Admitting: Internal Medicine

## 2018-05-08 ENCOUNTER — Ambulatory Visit
Admission: RE | Admit: 2018-05-08 | Discharge: 2018-05-08 | Disposition: A | Discharge status: Home / Self Care | Payer: Medicare Other | Source: Ambulatory Visit | Attending: General Surgery | Admitting: General Surgery

## 2018-05-08 DIAGNOSIS — Z1231 Encounter for screening mammogram for malignant neoplasm of breast: Secondary | ICD-10-CM | POA: Diagnosis present

## 2018-05-08 DIAGNOSIS — Z79811 Long term (current) use of aromatase inhibitors: Secondary | ICD-10-CM

## 2018-05-09 ENCOUNTER — Inpatient Hospital Stay: Payer: Medicare Other | Attending: Internal Medicine | Admitting: Internal Medicine

## 2018-05-09 ENCOUNTER — Encounter: Payer: Self-pay | Admitting: Surgery

## 2018-05-09 ENCOUNTER — Ambulatory Visit (INDEPENDENT_AMBULATORY_CARE_PROVIDER_SITE_OTHER): Payer: Medicare Other | Admitting: Surgery

## 2018-05-09 ENCOUNTER — Ambulatory Visit: Payer: Self-pay | Admitting: Surgery

## 2018-05-09 VITALS — BP 137/81 | HR 74 | Temp 97.8°F | Ht 67.0 in | Wt 192.8 lb

## 2018-05-09 DIAGNOSIS — Z853 Personal history of malignant neoplasm of breast: Secondary | ICD-10-CM | POA: Diagnosis not present

## 2018-05-09 NOTE — Patient Instructions (Signed)
We will call you with your appointment information. You will need to have your Mammogram in 6 months, November 2019.  We will also schedule an office visit to have your breast exam.  If you do not hear from [place] by the end of October please call and let us know that we need to schedule your appointments.

## 2018-05-10 ENCOUNTER — Ambulatory Visit: Payer: Self-pay | Admitting: Surgery

## 2018-05-13 ENCOUNTER — Encounter: Payer: Self-pay | Admitting: Surgery

## 2018-05-13 NOTE — Progress Notes (Signed)
Surgical Clinic Progress/Follow-up Note   HPI:  77 y.o. Female presents to clinic for follow-up evaluation 1 year s/p Left mastectomy for invasive breast cancer ([place], 07/16/2018) and to discuss the results of her recent screening Right mammogram (05/08/2016). Patient reports she's been feeling well with no breast, chest wall, or axillary pain, nipple discharge, fever/chills, unintentional weight loss, N/V, CP, or SOB.  Review of Systems:  Constitutional: denies any other weight loss, fever, chills, or sweats  Eyes: denies any other vision changes, history of eye injury  ENT: denies sore throat, hearing problems  Respiratory: denies shortness of breath, wheezing  Cardiovascular: denies chest pain, palpitations Breast: as per HPI Gastrointestinal: denies abdominal pain, N/V, or diarrhea Musculoskeletal: denies any other joint pains or cramps  Skin: Denies any other rashes or skin discolorations  Neurological: denies any other headache, dizziness, weakness  Psychiatric: denies any other depression, anxiety  All other review of systems: otherwise negative   Vital Signs:  BP 137/81   Pulse 74   Temp 97.8 F (36.6 C) (Oral)   Ht 5\' 7"  (1.702 m)   Wt 192 lb 12.8 oz (87.5 kg)   BMI 30.20 kg/m    Physical Exam:  Constitutional:  -- Normal body habitus  -- Awake, alert, and oriented x3  Eyes:  -- Pupils equally round and reactive to light  -- No scleral icterus  Ear, nose, throat:  -- No jugular venous distension  -- No nasal drainage, bleeding Pulmonary:  -- No crackles -- Equal breath sounds bilaterally -- Breathing non-labored at rest Cardiovascular:  -- S1, S2 present  -- No pericardial rubs Breast: -- Left post-mastectomy chest wall well-healed -- Right breast without any palpable masses or axillary lymphadenopathy Gastrointestinal:  -- Soft, nontender, non-distended, no guarding/rebound  -- No abdominal masses appreciated, pulsatile or otherwise  Musculoskeletal /  Integumentary:  -- Wounds or skin discoloration: well-healed Left mastectomy site  -- Extremities: B/L UE and LE FROM, hands and feet warm, no edema  Neurologic:  -- Motor function: intact and symmetric  -- Sensation: intact and symmetric   Laboratory studies:  CBC:  Lab Results  Component Value Date   WBC 6.7 11/15/2017   RBC 4.60 11/15/2017   BMP:  Lab Results  Component Value Date   GLUCOSE 100 (H) 07/08/2016   GLUCOSE 94 09/11/2012   CO2 33 (H) 07/08/2016   CO2 29 09/11/2012   BUN 32 (H) 07/08/2016   BUN 27 (H) 09/11/2012   CREATININE 0.83 07/08/2016   CREATININE 0.80 09/11/2012   CALCIUM 10.2 07/08/2016   CALCIUM 9.7 09/11/2012    Imaging:  Right Breast Screening Mammogram (05/08/2018) The patient has had a left mastectomy. There are no  findings suspicious for malignancy. ACR Breast Density  Category b: There are scattered areas of fibroglandular  density. BI-RADS CATEGORY 1: Negative.  Assessment:  77 y.o. yo Female with a problem list including...  Patient Active Problem List   Diagnosis Date Noted  . 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 HLD (hyperlipidemia) 04/01/2016  . Episodic recurrent vertigo 04/30/2015  . 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  Plan:   - repeat mammogram in 6 months per post-breast cancer protocol  - if this next mammogram is WNL, resume annual screening mammogram  - return to  clinic following next mammogram for exam and to discuss results  - instructed to call office if any questions or concerns  All of the above recommendations were discussed with the patient, and all of patient's questions were answered to her expressed satisfaction.  -- Marilynne Drivers Rosana Hoes, MD, [place]:  [facility] - Partnering for exceptional care. Office: [phone]

## 2018-05-22 ENCOUNTER — Telehealth: Payer: Self-pay | Admitting: Internal Medicine

## 2018-05-22 NOTE — Telephone Encounter (Signed)
Please have patient see me in [facility] clinic in July 2 week/tuesday for follow up; no labs.

## 2018-06-20 ENCOUNTER — Inpatient Hospital Stay: Payer: 59 | Admitting: Internal Medicine

## 2018-06-20 ENCOUNTER — Ambulatory Visit: Payer: 59 | Admitting: Internal Medicine

## 2018-06-27 ENCOUNTER — Encounter: Payer: Self-pay | Admitting: Internal Medicine

## 2018-06-27 ENCOUNTER — Inpatient Hospital Stay: Payer: Medicare Other | Attending: Internal Medicine | Admitting: Internal Medicine

## 2018-06-27 ENCOUNTER — Other Ambulatory Visit: Payer: Self-pay

## 2018-06-27 VITALS — BP 118/76 | HR 70 | Temp 97.6°F | Resp 20 | Ht 67.0 in | Wt 187.4 lb

## 2018-06-27 DIAGNOSIS — Z79811 Long term (current) use of aromatase inhibitors: Secondary | ICD-10-CM | POA: Diagnosis not present

## 2018-06-27 DIAGNOSIS — Z79899 Other long term (current) drug therapy: Secondary | ICD-10-CM | POA: Diagnosis not present

## 2018-06-27 DIAGNOSIS — Z9012 Acquired absence of left breast and nipple: Secondary | ICD-10-CM

## 2018-06-27 DIAGNOSIS — Z170 Estrogen receptor positive status [ER+]: Secondary | ICD-10-CM | POA: Diagnosis not present

## 2018-06-27 DIAGNOSIS — Z87891 Personal history of nicotine dependence: Secondary | ICD-10-CM | POA: Diagnosis not present

## 2018-06-27 DIAGNOSIS — M8580 Other specified disorders of bone density and structure, unspecified site: Secondary | ICD-10-CM

## 2018-06-27 DIAGNOSIS — C50512 Malignant neoplasm of lower-outer quadrant of left female breast: Secondary | ICD-10-CM | POA: Diagnosis present

## 2018-06-27 DIAGNOSIS — E785 Hyperlipidemia, unspecified: Secondary | ICD-10-CM

## 2018-06-27 MED ORDER — ANASTROZOLE 1 MG PO TABS: 1.0000 mg | ORAL_TABLET | Freq: Every day | ORAL | 3 refills | Status: DC

## 2018-06-27 NOTE — Assessment & Plan Note (Addendum)
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 Status post mastectomy. No role for any radiation. Low risk Oncotype. On Arimidex   # Clinically no evidence of recurrence/STABLE. May 2019- wnl.   # BMD- May 2019;  Osteopenia: T score= -1.6; STABLE. on Ca+ Vit D.   # follow up in 6 months.   Cc: Dr.Aldridge.

## 2018-07-01 NOTE — Progress Notes (Signed)
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, estrogen receptor positive [facility]) Staging form: Breast, AJCC 7th Edition - Clinical stage from 06/18/2016: Stage IA (T1c, N0, M0) - Signed by Lloyd Huger, MD on 06/18/2016    Oncology History   # 2017-STAGE I; BREAST CANCER LEFT s/p Mastec; ER/PR POS; her 2 NEG; Oncotype LOW risk; Recom Arimidex  # BMD May 2017- Osteopenia/ ca+ Vit D   DIAGNOSIS: BREAST CA  STAGE:     I    ;GOALS: curative  CURRENT/MOST RECENT THERAPY [ ]       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  female pleasant patient above history of stage I breast cancer currently on Arimidex is here for follow-up.  Patient denies any unusual joint pains or chronic bone pain.  No hot flashes.  Review of Systems  Constitutional: Negative for chills, diaphoresis, fever, malaise/fatigue and weight loss.  HENT: Negative for nosebleeds and sore throat.   Eyes: Negative for double vision.  Respiratory: Negative for cough, hemoptysis, sputum production, shortness of breath and wheezing.   Cardiovascular: Negative for chest pain, palpitations, orthopnea and leg swelling.  Gastrointestinal: Negative for abdominal pain, blood in stool, constipation, diarrhea, heartburn, melena, nausea and vomiting.  Genitourinary: Negative for dysuria, frequency and urgency.  Musculoskeletal: Negative for back pain and joint pain.  Skin: Negative.  Negative for itching and rash.  Neurological: Negative for dizziness, tingling, focal weakness, weakness and headaches.  Endo/Heme/Allergies: Does not bruise/bleed easily.   Psychiatric/Behavioral: Negative for depression. The patient is not nervous/anxious and does not have insomnia.       PAST MEDICAL HISTORY :  Past Medical History:  Diagnosis Date  . Breast cancer ([place])   . Hyperlipidemia   . Motion sickness    ships  . PONV (postoperative nausea and vomiting)   . Vertigo     PAST SURGICAL HISTORY :   Past Surgical History:  Procedure Laterality Date  . ABDOMINAL HYSTERECTOMY    . BREAST BIOPSY Left 06/09/2016   left [place] core bx positvie  . BREAST CYST ASPIRATION Left    neg  . BROW LIFT Bilateral 09/06/2017   Procedure: BLEPHAROPLASTY;  Surgeon: Karle Starch, MD;  Location: [place];  Service: Ophthalmology;  Laterality: Bilateral;  MAC  . MASTECTOMY Left 07/17/2016  . MASTECTOMY W/ SENTINEL NODE BIOPSY Left 07/16/2016   Procedure: MASTECTOMY WITH SENTINEL LYMPH NODE BIOPSY;  Surgeon: Hubbard Robinson, MD;  Location: [facility];  Service: General;  Laterality: Left;  . PILONIDAL CYST / SINUS EXCISION    . TOE SURGERY      FAMILY HISTORY :   Family History  Problem Relation Age of Onset  . Cancer Mother 58       Liver  . Breast cancer Neg Hx     SOCIAL HISTORY:   Social History   Tobacco Use  . Smoking status: Former Research scientist (life sciences)  . Smokeless tobacco: Never Used  . Tobacco comment: while in early 20s  Substance Use Topics  . Alcohol use: Yes    Alcohol/week: 0.0 oz    Comment: 1 Scotch Daily  .  Drug use: No    ALLERGIES:  has No Known Allergies.  MEDICATIONS:  Current Outpatient Medications  Medication Sig Dispense Refill  . anastrozole (ARIMIDEX) 1 MG tablet Take 1 tablet (1 mg total) by mouth daily. 90 tablet 3  . Calcium 600-200 MG-UNIT tablet Take 1 tablet by mouth 2 (two) times daily.    . Multiple Vitamin (MULTIVITAMIN) capsule Take 1 capsule by mouth daily.     Marland Kitchen triamterene-hydrochlorothiazide (DYAZIDE) 37.5-25 MG capsule Take 1 capsule by mouth daily.      No current facility-administered medications for  this visit.     PHYSICAL EXAMINATION: ECOG PERFORMANCE STATUS: 0 - Asymptomatic  BP 118/76 (Patient Position: Sitting)   Pulse 70   Temp 97.6 F (36.4 C) (Tympanic)   Resp 20   Ht 5' 7"  (1.702 m)   Wt 187 lb 6.3 oz (85 kg)   BMI 29.35 kg/m   Filed Weights   06/27/18 1410  Weight: 187 lb 6.3 oz (85 kg)    GENERAL: Well-nourished well-developed; Alert, no distress and comfortable.  Alone. OROPHARYNX: no thrush or ulceration; NECK: supple; no lymph nodes felt. LYMPH:  no palpable lymphadenopathy in the axillary or inguinal regions LUNGS: Decreased breath sounds auscultation bilaterally. No wheeze or crackles HEART/CVS: regular rate & rhythm and no murmurs; No lower extremity edema ABDOMEN:abdomen soft, non-tender and normal bowel sounds. No hepatomegaly or splenomegaly.  Musculoskeletal:no cyanosis of digits and no clubbing  PSYCH: alert & oriented x 3 with fluent speech NEURO: no focal motor/sensory deficits SKIN:  no rashes or significant lesions  Right and left BREAST exam [in the presence of nurse]- no unusual skin changes or dominant masses felt. Surgical scars noted.    LABORATORY DATA:  I have reviewed the data as listed    Component Value Date/Time   NA 140 07/08/2016 1353   NA 142 09/11/2012 2242   K 3.6 07/08/2016 1353   K 3.8 09/11/2012 2242   CL 100 (L) 07/08/2016 1353   CL 104 09/11/2012 2242   CO2 33 (H) 07/08/2016 1353   CO2 29 09/11/2012 2242   GLUCOSE 100 (H) 07/08/2016 1353   GLUCOSE 94 09/11/2012 2242   BUN 32 (H) 07/08/2016 1353   BUN 27 (H) 09/11/2012 2242   CREATININE 0.83 07/08/2016 1353   CREATININE 0.80 09/11/2012 2242   CALCIUM 10.2 07/08/2016 1353   CALCIUM 9.7 09/11/2012 2242   PROT 7.5 09/11/2012 2242   ALBUMIN 4.4 09/11/2012 2242   AST 19 09/11/2012 2242   ALT 19 09/11/2012 2242   ALKPHOS 72 09/11/2012 2242   BILITOT 0.4 09/11/2012 2242   GFRNONAA >60 07/08/2016 1353   GFRNONAA >60 09/11/2012 2242   GFRAA >60 07/08/2016 1353    GFRAA >60 09/11/2012 2242    No results found for: SPEP, UPEP  Lab Results  Component Value Date   WBC 6.7 11/15/2017   NEUTROABS 4.5 11/15/2017   HGB 14.2 11/15/2017   HCT 41.6 11/15/2017   MCV 90.4 11/15/2017   PLT 270 11/15/2017      Chemistry      Component Value Date/Time   NA 140 07/08/2016 1353   NA 142 09/11/2012 2242   K 3.6 07/08/2016 1353   K 3.8 09/11/2012 2242   CL 100 (L) 07/08/2016 1353   CL 104 09/11/2012 2242   CO2 33 (H) 07/08/2016 1353   CO2 29 09/11/2012 2242   BUN 32 (H) 07/08/2016 1353   BUN 27 (H) 09/11/2012 2242   CREATININE 0.83  07/08/2016 1353   CREATININE 0.80 09/11/2012 2242      Component Value Date/Time   CALCIUM 10.2 07/08/2016 1353   CALCIUM 9.7 09/11/2012 2242   ALKPHOS 72 09/11/2012 2242   AST 19 09/11/2012 2242   ALT 19 09/11/2012 2242   BILITOT 0.4 09/11/2012 2242       RADIOGRAPHIC STUDIES: I have personally reviewed the radiological images as listed and agreed with the findings in the report. No results found.   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 Status post mastectomy. No role for any radiation. Low risk Oncotype. On Arimidex   # Clinically no evidence of recurrence/STABLE. May 2019- wnl.   # BMD- May 2019;  Osteopenia: T score= -1.6; STABLE. on Ca+ Vit D.   # follow up in 6 months.   Cc: Dr.Aldridge.    No orders of the defined types were placed in this encounter.  All questions were answered. The patient knows to call the clinic with any problems, questions or concerns.      Cammie Sickle, MD 07/01/2018 2:17 PM

## 2018-07-05 DIAGNOSIS — M85832 Other specified disorders of bone density and structure, left forearm: Secondary | ICD-10-CM | POA: Insufficient documentation

## 2019-01-02 ENCOUNTER — Inpatient Hospital Stay: Payer: Medicare Other | Attending: Internal Medicine | Admitting: Internal Medicine

## 2019-01-02 ENCOUNTER — Encounter: Payer: Self-pay | Admitting: Internal Medicine

## 2019-01-02 VITALS — BP 111/72 | HR 66 | Temp 97.1°F | Resp 16 | Wt 193.8 lb

## 2019-01-02 DIAGNOSIS — Z170 Estrogen receptor positive status [ER+]: Secondary | ICD-10-CM | POA: Diagnosis not present

## 2019-01-02 DIAGNOSIS — Z79811 Long term (current) use of aromatase inhibitors: Secondary | ICD-10-CM

## 2019-01-02 DIAGNOSIS — Z9012 Acquired absence of left breast and nipple: Secondary | ICD-10-CM | POA: Diagnosis not present

## 2019-01-02 DIAGNOSIS — Z87891 Personal history of nicotine dependence: Secondary | ICD-10-CM | POA: Diagnosis not present

## 2019-01-02 DIAGNOSIS — M8580 Other specified disorders of bone density and structure, unspecified site: Secondary | ICD-10-CM | POA: Insufficient documentation

## 2019-01-02 DIAGNOSIS — Z79899 Other long term (current) drug therapy: Secondary | ICD-10-CM

## 2019-01-02 DIAGNOSIS — C50512 Malignant neoplasm of lower-outer quadrant of left female breast: Secondary | ICD-10-CM | POA: Insufficient documentation

## 2019-01-02 NOTE — Progress Notes (Signed)
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, estrogen receptor positive [facility]) Staging form: Breast, AJCC 7th Edition - Clinical stage from 06/18/2016: Stage IA (T1c, N0, M0) - Signed by Lloyd Huger, MD on 06/18/2016    Oncology History   # 2017-STAGE I; BREAST CANCER LEFT s/p Mastec; ER/PR POS; her 2 NEG; Oncotype LOW risk;  Arimidex  # BMD May 2019- Osteopenia/ ca+ Vit D   DIAGNOSIS: BREAST CA  STAGE:     I    ;GOALS: curative  CURRENT/MOST RECENT THERAPY : arimidex      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  female pleasant patient above history of stage I breast cancer currently on Arimidex is here for follow-up.  Patient denies any unusual aches and pains or bone pain.  No nausea no vomiting pain no hot flashes.  She has been compliant with her Arimidex.  Patient denies any unusual joint pains or chronic bone pain.  No hot flashes.  Review of Systems  Constitutional: Negative for chills, diaphoresis, fever, malaise/fatigue and weight loss.  HENT: Negative for nosebleeds and sore throat.   Eyes: Negative for double vision.  Respiratory: Negative for cough, hemoptysis, sputum production, shortness of breath and wheezing.   Cardiovascular: Negative for chest pain, palpitations, orthopnea and leg swelling.  Gastrointestinal: Negative for abdominal pain, blood in stool, constipation, diarrhea, heartburn, melena, nausea and vomiting.  Genitourinary: Negative for dysuria, frequency and urgency.  Musculoskeletal: Negative for back pain and joint pain.  Skin: Negative.  Negative for itching and rash.   Neurological: Negative for dizziness, tingling, focal weakness, weakness and headaches.  Endo/Heme/Allergies: Does not bruise/bleed easily.  Psychiatric/Behavioral: Negative for depression. The patient is not nervous/anxious and does not have insomnia.       PAST MEDICAL HISTORY :  Past Medical History:  Diagnosis Date  . Breast cancer ([place])   . Hyperlipidemia   . Motion sickness    ships  . PONV (postoperative nausea and vomiting)   . Vertigo     PAST SURGICAL HISTORY :   Past Surgical History:  Procedure Laterality Date  . ABDOMINAL HYSTERECTOMY    . BREAST BIOPSY Left 06/09/2016   left [place] core bx positvie  . BREAST CYST ASPIRATION Left    neg  . BROW LIFT Bilateral 09/06/2017   Procedure: BLEPHAROPLASTY;  Surgeon: Karle Starch, MD;  Location: [place];  Service: Ophthalmology;  Laterality: Bilateral;  MAC  . MASTECTOMY Left 07/17/2016  . MASTECTOMY W/ SENTINEL NODE BIOPSY Left 07/16/2016   Procedure: MASTECTOMY WITH SENTINEL LYMPH NODE BIOPSY;  Surgeon: Hubbard Robinson, MD;  Location: [facility];  Service: General;  Laterality: Left;  . PILONIDAL CYST / SINUS EXCISION    . TOE SURGERY      FAMILY HISTORY :   Family History  Problem Relation Age of Onset  . Cancer Mother 64       Liver  . Breast cancer Neg Hx     SOCIAL HISTORY:   Social History   Tobacco Use  . Smoking status: Former Research scientist (life sciences)  . Smokeless tobacco: Never Used  . Tobacco comment: while in  early 54s  Substance Use Topics  . Alcohol use: Yes    Alcohol/week: 0.0 standard drinks    Comment: 1 Scotch Daily  . Drug use: No    ALLERGIES:  has No Known Allergies.  MEDICATIONS:  Current Outpatient Medications  Medication Sig Dispense Refill  . anastrozole (ARIMIDEX) 1 MG tablet Take 1 tablet (1 mg total) by mouth daily. 90 tablet 3  . Calcium 600-200 MG-UNIT tablet Take 1 tablet by mouth 2 (two) times daily.    . Multiple Vitamin (MULTIVITAMIN) capsule Take 1 capsule by mouth daily.      Marland Kitchen triamterene-hydrochlorothiazide (DYAZIDE) 37.5-25 MG capsule Take 1 capsule by mouth daily.      No current facility-administered medications for this visit.     PHYSICAL EXAMINATION: ECOG PERFORMANCE STATUS: 0 - Asymptomatic  BP 111/72 (BP Location: Left Arm, Patient Position: Sitting, Cuff Size: Normal)   Pulse 66   Temp (!) 97.1 F (36.2 C) (Tympanic)   Resp 16   Wt 193 lb 12.6 oz (87.9 kg)   BMI 30.35 kg/m   Filed Weights   01/02/19 0958  Weight: 193 lb 12.6 oz (87.9 kg)   Physical Exam  Constitutional: She is oriented to person, place, and time and well-developed, well-nourished, and in no distress.  HENT:  Head: Normocephalic and atraumatic.  Mouth/Throat: Oropharynx is clear and moist. No oropharyngeal exudate.  Eyes: Pupils are equal, round, and reactive to light.  Neck: Normal range of motion. Neck supple.  Cardiovascular: Normal rate and regular rhythm.  Pulmonary/Chest: No respiratory distress. She has no wheezes.  Abdominal: Soft. Bowel sounds are normal. She exhibits no distension and no mass. There is no abdominal tenderness. There is no rebound and no guarding.  Musculoskeletal: Normal range of motion.        General: No tenderness or edema.  Neurological: She is alert and oriented to person, place, and time.  Skin: Skin is warm.  Right and left BREAST exam (in the presence of nurse)- no unusual skin changes or dominant masses felt. Surgical scars noted.    Psychiatric: Affect normal.      LABORATORY DATA:  I have reviewed the data as listed    Component Value Date/Time   NA 140 07/08/2016 1353   NA 142 09/11/2012 2242   K 3.6 07/08/2016 1353   K 3.8 09/11/2012 2242   CL 100 (L) 07/08/2016 1353   CL 104 09/11/2012 2242   CO2 33 (H) 07/08/2016 1353   CO2 29 09/11/2012 2242   GLUCOSE 100 (H) 07/08/2016 1353   GLUCOSE 94 09/11/2012 2242   BUN 32 (H) 07/08/2016 1353   BUN 27 (H) 09/11/2012 2242   CREATININE 0.83 07/08/2016 1353   CREATININE  0.80 09/11/2012 2242   CALCIUM 10.2 07/08/2016 1353   CALCIUM 9.7 09/11/2012 2242   PROT 7.5 09/11/2012 2242   ALBUMIN 4.4 09/11/2012 2242   AST 19 09/11/2012 2242   ALT 19 09/11/2012 2242   ALKPHOS 72 09/11/2012 2242   BILITOT 0.4 09/11/2012 2242   GFRNONAA >60 07/08/2016 1353   GFRNONAA >60 09/11/2012 2242   GFRAA >60 07/08/2016 1353   GFRAA >60 09/11/2012 2242    No results found for: SPEP, UPEP  Lab Results  Component Value Date   WBC 6.7 11/15/2017   NEUTROABS 4.5 11/15/2017   HGB 14.2 11/15/2017   HCT 41.6 11/15/2017   MCV 90.4 11/15/2017   PLT 270 11/15/2017      Chemistry  Component Value Date/Time   NA 140 07/08/2016 1353   NA 142 09/11/2012 2242   K 3.6 07/08/2016 1353   K 3.8 09/11/2012 2242   CL 100 (L) 07/08/2016 1353   CL 104 09/11/2012 2242   CO2 33 (H) 07/08/2016 1353   CO2 29 09/11/2012 2242   BUN 32 (H) 07/08/2016 1353   BUN 27 (H) 09/11/2012 2242   CREATININE 0.83 07/08/2016 1353   CREATININE 0.80 09/11/2012 2242      Component Value Date/Time   CALCIUM 10.2 07/08/2016 1353   CALCIUM 9.7 09/11/2012 2242   ALKPHOS 72 09/11/2012 2242   AST 19 09/11/2012 2242   ALT 19 09/11/2012 2242   BILITOT 0.4 09/11/2012 2242       RADIOGRAPHIC STUDIES: I have personally reviewed the radiological images as listed and agreed with the findings in the report. No results found.   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  Again the patient's request discussed regarding ER PR HER-2/neu status and the rational for not offering chemotherapy/and continued antihormone therapy.  # Clinically no evidence of recurrence/mammogram May 2019 normal.  # BMD- May 2019;  Osteopenia: T score= -1.6; stable;  on Ca+ Vit D. Discussed the potential risk factors for osteoporosis- age/gender/postmenopausal status/use of anti-estrogen  treatments. Discussed multiple options including exercise/ calcium and vitamin D supplementation/ and also use of bisphosphonates. Discussed oral bisphosphonates versus parenteral bisphosphonate like Reclast/ RANK ligand inhibitors- desnosumab. For now recommend continue ca+vit D   # DISPOSITION: # mammogram screening- in may 2020 # follow up in 6 months/MD-Dr.Corcoran-Dr.B   Orders Placed This Encounter  Procedures  . MM 3D SCREEN BREAST UNI RIGHT    Standing Status:   Future    Standing Expiration Date:   03/02/2020    Order Specific Question:   Reason for Exam (SYMPTOM  OR DIAGNOSIS REQUIRED)    Answer:   breast cancer    Order Specific Question:   Preferred imaging location?    Answer:   [facility] [place]   All questions were answered. The patient knows to call the clinic with any problems, questions or concerns.      Cammie Sickle, MD 01/02/2019 10:29 AM

## 2019-01-02 NOTE — Assessment & Plan Note (Addendum)
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  Again the patient's request discussed regarding ER PR HER-2/neu status and the rational for not offering chemotherapy/and continued antihormone therapy.  # Clinically no evidence of recurrence/mammogram May 2019 normal.  # BMD- May 2019;  Osteopenia: T score= -1.6; stable;  on Ca+ Vit D. Discussed the potential risk factors for osteoporosis- age/gender/postmenopausal status/use of anti-estrogen treatments. Discussed multiple options including exercise/ calcium and vitamin D supplementation/ and also use of bisphosphonates. Discussed oral bisphosphonates versus parenteral bisphosphonate like Reclast/ RANK ligand inhibitors- desnosumab. For now recommend continue ca+vit D   # DISPOSITION: # mammogram screening- in may 2020 # follow up in 6 months/MD-Dr.Corcoran-Dr.B

## 2019-05-15 ENCOUNTER — Ambulatory Visit: Payer: Medicare Other

## 2019-05-23 ENCOUNTER — Other Ambulatory Visit: Payer: Self-pay

## 2019-05-23 ENCOUNTER — Encounter (INDEPENDENT_AMBULATORY_CARE_PROVIDER_SITE_OTHER): Payer: Self-pay

## 2019-05-23 ENCOUNTER — Ambulatory Visit
Admission: RE | Admit: 2019-05-23 | Discharge: 2019-05-23 | Disposition: A | Discharge status: Home / Self Care | Payer: Medicare Other | Source: Ambulatory Visit | Attending: Internal Medicine | Admitting: Internal Medicine

## 2019-05-23 DIAGNOSIS — Z170 Estrogen receptor positive status [ER+]: Secondary | ICD-10-CM | POA: Diagnosis not present

## 2019-05-23 DIAGNOSIS — C50512 Malignant neoplasm of lower-outer quadrant of left female breast: Secondary | ICD-10-CM | POA: Diagnosis not present

## 2019-05-23 DIAGNOSIS — Z1231 Encounter for screening mammogram for malignant neoplasm of breast: Secondary | ICD-10-CM | POA: Diagnosis not present

## 2019-05-24 ENCOUNTER — Ambulatory Visit: Payer: Medicare Other | Admitting: Surgery

## 2019-05-31 ENCOUNTER — Encounter: Payer: Self-pay | Admitting: Surgery

## 2019-05-31 ENCOUNTER — Other Ambulatory Visit: Payer: Self-pay

## 2019-05-31 ENCOUNTER — Ambulatory Visit: Payer: Medicare Other | Admitting: Surgery

## 2019-05-31 ENCOUNTER — Ambulatory Visit (INDEPENDENT_AMBULATORY_CARE_PROVIDER_SITE_OTHER): Payer: Medicare Other | Admitting: Surgery

## 2019-05-31 VITALS — BP 131/76 | HR 73 | Temp 95.2°F | Resp 16 | Ht 68.5 in | Wt 182.4 lb

## 2019-05-31 DIAGNOSIS — Z853 Personal history of malignant neoplasm of breast: Secondary | ICD-10-CM

## 2019-05-31 NOTE — Patient Instructions (Signed)
We will call to schedule your mammogram and follow up appointment with Dr.Davis May 2021. If you have not heard from our office please call and let us know.

## 2019-06-01 NOTE — Progress Notes (Signed)
Surgical Clinic Progress/Follow-up Note   HPI:  78 y.o. Female presents to clinic for follow-up evaluation nearly 3 years s/p Left mastectomy ([place], 07/16/2016) for invasive mammary carcinoma and to discuss the results of her recent screening Right mammogram (05/23/2019). Patient reports she's been feeling well without breast, chest wall, or axillary pain, and she denies any palpable mass(es), nipple discharge, fever/chills, unintentional weight loss, N/V, CP, or SOB.  Review of Systems:  Constitutional: denies any other weight loss, fever, chills, or sweats  Eyes: denies any other vision changes, history of eye injury  ENT: denies sore throat, hearing problems  Respiratory: denies shortness of breath, wheezing  Cardiovascular: denies chest pain, palpitations Breast: pain, mass(es), and nipple discharge as per interval history Gastrointestinal: denies abdominal pain, N/V, or diarrhea Musculoskeletal: denies any other joint pains or cramps  Skin: Denies any other rashes or skin discolorations  Neurological: denies any other headache, dizziness, weakness  Psychiatric: denies any other depression, anxiety  All other review of systems: otherwise negative   Vital Signs:  BP 131/76   Pulse 73   Temp (!) 95.2 F (35.1 C) (Temporal)   Resp 16   Ht 5' 8.5" (1.74 m)   Wt 182 lb 6.4 oz (82.7 kg)   SpO2 97%   BMI 27.33 kg/m    Physical Exam:  Constitutional:  -- Normal body habitus  -- Awake, alert, and oriented x3  Eyes:  -- Pupils equally round and reactive to light  -- No scleral icterus  Ear, nose, throat:  -- No jugular venous distension  -- No nasal drainage, bleeding Pulmonary:  -- No crackles -- Equal breath sounds bilaterally -- Breathing non-labored at rest Cardiovascular:  -- S1, S2 present  -- No pericardial rubs  Breast: -- Left post-mastectomy chest wall surgical site well-healed without tenderness to palpation, surrounding erythema, or drainage -- Right breast  non-tender to palpation without palpable mass(es), axillary lymphadenopathy, or nipple discharge Gastrointestinal:  -- Soft, nontender, non-distended, no guarding/rebound  -- No abdominal masses appreciated, pulsatile or otherwise  Musculoskeletal / Integumentary:  -- Wounds or skin discoloration: None appreciated except as described above (Breast)  -- Extremities: B/L UE and LE FROM, hands and feet warm, no edema  Neurologic:  -- Motor function: intact and symmetric  -- Sensation: intact and symmetric   Imaging:  Annual Screening Right Mammogram (05/23/2019) No mammographic evidence of malignancy.  BI-RADS Category 1: Negative  Assessment:  78 y.o. yo Female with a problem list including...  Patient Active Problem List   Diagnosis Date Noted  . 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 HLD (hyperlipidemia) 04/01/2016  . Episodic recurrent vertigo 04/30/2015  . 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    presents to clinic for follow-up evaluation, doing well without any evidence of malignancy on exam or recent screening Right mammogram nearly 3 years Left mastectomy ([place], 07/16/2016) for invasive mammary carcinoma.  Plan:   - will order annual screening Right mammogram for 1 year  - return to clinic following next mammogram for exam and to discuss results  - instructed to call office if any questions or concerns  All of the above recommendations were discussed with the patient, and all of patient's questions were answered to her expressed satisfaction.  -- Marilynne Drivers Rosana Hoes, MD, [place]: [facility] - Partnering for exceptional care. Office: [phone]

## 2019-06-03 ENCOUNTER — Encounter: Payer: Self-pay | Admitting: Surgery

## 2019-07-03 ENCOUNTER — Ambulatory Visit: Payer: Medicare Other | Admitting: Hematology and Oncology

## 2019-08-01 NOTE — Progress Notes (Signed)
[facility]  [address] [place], [place] [address] Phone: [phone]  Fax: [phone]   Clinic Day:  08/03/2019  Referring physician: Gayland Curry, MD  Chief Complaint: Melanie Simmons is a 78 y.o. female with stage IA left breast cancer who is seen for new patient assessment   HPI: The patient underwent yearly screening mammograms. Bilateral screening mammogram on 05/03/2016 revealed a possible mass in the left breast. Diagnostic left breast mammogram on 05/14/2016 revealed a mass in the outer left breast measuring 1.1cm. Physical examination revealed thickening at the 3 o'clock far lateral position. Left breast ultrasound revealed a cyst with an internal solid component/mass demonstrating increased vascularity at 3 o'clock 8 cm from nipple measuring 1.1 x 1 x 1.2 cm. There was no left axillary adenopathy.   Biopsy on 06/09/2016 revealed invasive mammary carcinoma, grade I, ER+ >90%, PR positive >90%, HER-2/neu negative (1+).   Left mastectomy with Dr. Azalee Course on 07/16/2016 revealed a 7 mm grade I invasive mammary carcinoma, with adjacent intraductal papillary carcinoma and DCIS.  Tumor was ER+ 100%, PR+ 100%, HER-2/neu negative. Two sentinel lymph nodes were negative for malignancy. Oncotype DX revealed a score of 0, with a 3% chance of recurrence of 10 years with tamoxifen alone.   She did not receive radiation therapy.  She began Arimidex on 09/02/2016 with a planned treatment duration of 5 years.   She was initially under the care of Dr. Grayland Ormond and transitioned to the care of Dr. Rogue Bussing on 09/02/2016. Notes reviewed.   Bone density on 05/03/2016 revealed osteopenia with a T-score of -1.6 at the forearm radius. Bone density on 05/08/2018 revealed osteopenia with a T-score of -1.6 at the forearm radius. She is currently on calcium and Vitamin D.   The patient was last seen in the medical oncology clinic on 01/02/2019 by Dr. Rogue Bussing.  At that time, she was doing well. She denied any bone or joint pain. She denied any hot flashes. She continued on Arimidex.   Right breast mammogram on 05/23/2019 revealed no evidence of malignancy.   During the interim, she feels "great." She denies any complaints. She denies any breast concerns. She continues on Arimidex.   She denies any family history of breast cancer; her mother passed away from possible liver cancer.    Past Medical History:  Diagnosis Date   Breast cancer ([place])    Hyperlipidemia    Motion sickness    ships   PONV (postoperative nausea and vomiting)    Vertigo     Past Surgical History:  Procedure Laterality Date   ABDOMINAL HYSTERECTOMY     BREAST BIOPSY Left 06/09/2016   left [place] core bx positvie   BREAST CYST ASPIRATION Left    neg   BROW LIFT Bilateral 09/06/2017   Procedure: BLEPHAROPLASTY;  Surgeon: Karle Starch, MD;  Location: [facility];  Service: Ophthalmology;  Laterality: Bilateral;  MAC   MASTECTOMY Left 07/17/2016   MASTECTOMY W/ SENTINEL NODE BIOPSY Left 07/16/2016   Procedure: MASTECTOMY WITH SENTINEL LYMPH NODE BIOPSY;  Surgeon: Hubbard Robinson, MD;  Location: [facility];  Service: General;  Laterality: Left;   PILONIDAL CYST / SINUS EXCISION     TOE SURGERY      Family History  Problem Relation Age of Onset   Cancer Mother 41       Liver   Breast cancer Neg Hx     Social History:  reports that she has quit smoking. She has  never used smokeless tobacco. She reports current alcohol use. She reports that she does not use drugs. She does not smoke or use tobacco. She drinks a cocktail once daily. She denies any known exposure to radiation or toxins. 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 She could not get hired here in [place] after moving from [place]. She moved to [place] because of her husband's job. She plays golf 2x weekly and  walks 2 miles every day. She lives in [place] with her husband. The patient is alone today.  Allergies: No Known Allergies  Current Medications: Current Outpatient Medications  Medication Sig Dispense Refill   anastrozole (ARIMIDEX) 1 MG tablet Take 1 tablet (1 mg total) by mouth daily. 90 tablet 3   Calcium 600-200 MG-UNIT tablet Take 1 tablet by mouth 2 (two) times daily.     Multiple Vitamin (MULTIVITAMIN) capsule Take 1 capsule by mouth daily.      triamterene-hydrochlorothiazide (DYAZIDE) 37.5-25 MG capsule Take 1 capsule by mouth daily.      No current facility-administered medications for this visit.     Review of Systems  Constitutional: Negative.  Negative for chills, diaphoresis, fever, malaise/fatigue and weight loss.       Feels "great."  HENT: Negative.  Negative for hearing loss, nosebleeds and sore throat.   Eyes: Negative.  Negative for blurred vision and double vision.  Respiratory: Negative.  Negative for cough, shortness of breath and wheezing.   Cardiovascular: Negative.  Negative for chest pain, palpitations, orthopnea, leg swelling and PND.  Gastrointestinal: Negative.  Negative for abdominal pain, blood in stool, constipation, diarrhea, melena, nausea and vomiting.  Genitourinary: Negative.  Negative for dysuria, frequency, hematuria and urgency.  Musculoskeletal: Negative.  Negative for back pain, joint pain and myalgias.  Skin: Negative.  Negative for itching and rash.  Neurological: Negative.  Negative for dizziness, tingling, sensory change, focal weakness, weakness and headaches.  Endo/Heme/Allergies: Negative.  Does not bruise/bleed easily.  Psychiatric/Behavioral: Negative.  Negative for depression, memory loss and substance abuse. The patient is not nervous/anxious and does not have insomnia.   All other systems reviewed and are negative.  Performance status (ECOG): 0  Vitals Blood pressure 117/65, pulse 62, temperature (!) 97.2 F (36.2 C),  temperature source Tympanic, resp. rate 16, weight 179 lb 14.3 oz (81.6 kg), SpO2 97 %.   Physical Exam  Constitutional: She is oriented to person, place, and time. She appears well-developed and well-nourished. No distress.  HENT:  Head: Normocephalic and atraumatic.  Right Ear: External ear normal.  Left Ear: External ear normal.  Nose: Nose normal.  Mouth/Throat: Oropharynx is clear and moist. No oropharyngeal exudate.  Short blonde hair. Wearing a mask.  Eyes: Pupils are equal, round, and reactive to light. Conjunctivae and EOM are normal. No scleral icterus.  Blue/green eyes.   Neck: Normal range of motion. Neck supple.  Cardiovascular: Normal rate, regular rhythm and normal heart sounds. Exam reveals no gallop and no friction rub.  No murmur heard. Pulmonary/Chest: Effort normal and breath sounds normal. No respiratory distress. She has no wheezes. She has no rales. Right breast exhibits no inverted nipple, no mass, no nipple discharge, no skin change (minimal fibrocystic changes) and no tenderness. Left breast exhibits no mass, no skin change and no tenderness. Breasts are asymmetrical (s/p left mastectomy).  Abdominal: Soft. Bowel sounds are normal. She exhibits no distension and no mass. There is  no abdominal tenderness. There is no rebound and no guarding.  Musculoskeletal: Normal range of motion.        General: No edema.  Lymphadenopathy:    She has no cervical adenopathy.    She has no axillary adenopathy.       Right: No supraclavicular adenopathy present.       Left: No supraclavicular adenopathy present.  Neurological: She is alert and oriented to person, place, and time. No cranial nerve deficit.  Skin: Skin is warm and dry. No rash noted. She is not diaphoretic. No erythema. No pallor.  Psychiatric: She has a normal mood and affect. Her behavior is normal. Judgment and thought content normal.  Nursing note and vitals reviewed.   No visits with results within 3 Day(s)  from this visit.  Latest known visit with results is:  Appointment on 11/15/2017  Component Date Value Ref Range Status   WBC 11/15/2017 6.7  3.6 - 11.0 K/uL Final   RBC 11/15/2017 4.60  3.80 - 5.20 MIL/uL Final   Hemoglobin 11/15/2017 14.2  12.0 - 16.0 g/dL Final   HCT 11/15/2017 41.6  35.0 - 47.0 % Final   MCV 11/15/2017 90.4  80.0 - 100.0 fL Final   MCH 11/15/2017 30.8  26.0 - 34.0 pg Final   MCHC 11/15/2017 34.1  32.0 - 36.0 g/dL Final   RDW 11/15/2017 13.8  11.5 - 14.5 % Final   Platelets 11/15/2017 270  150 - 440 K/uL Final   Neutrophils Relative % 11/15/2017 68  % Final   Neutro Abs 11/15/2017 4.5  1.4 - 6.5 K/uL Final   Lymphocytes Relative 11/15/2017 21  % Final   Lymphs Abs 11/15/2017 1.4  1.0 - 3.6 K/uL Final   Monocytes Relative 11/15/2017 8  % Final   Monocytes Absolute 11/15/2017 0.5  0.2 - 0.9 K/uL Final   Eosinophils Relative 11/15/2017 2  % Final   Eosinophils Absolute 11/15/2017 0.1  0 - 0.7 K/uL Final   Basophils Relative 11/15/2017 1  % Final   Basophils Absolute 11/15/2017 0.1  0 - 0.1 K/uL Final    Assessment:  Melanie Simmons is a 78 y.o. female with stage IA left breast cancer s/p mastectomy on 07/16/2016.  Pathology revealed a 7 mm grade I invasive mammary carcinoma, with adjacent intraductal papillary carcinoma and DCIS.  Tumor was ER+ 100%, PR+ 100%, HER-2/neu negative. Two sentinel lymph nodes were negative.  Pathologic stage was pT1b pN0.  Bilateral screening mammogram on 05/03/2016 revealed a possible mass in the left breast. Diagnostic left breast mammogram on 05/14/2016 revealed a mass in the outer left breast measuring 1.1cm.  Oncotype DX revealed a score of 0, with a 3% chance of recurrence of 10 years with tamoxifen alone.  She began Arimidex on 09/02/2016.  Right breast mammogram on 05/23/2019 revealed no evidence of malignancy.   Bone density on 05/03/2016 revealed osteopenia with a T-score of -1.6 at the forearm radius.   Bone density on 05/08/2018 revealed osteopenia with a T-score of -1.6 at the forearm radius. She is currently on calcium and Vitamin D.   Symptomatically, she is doing well.  Exam is unremarkable.  CA27.29 is 16.2 (normal).  Plan: 1. Labs today: CBC with diff, CMP, CA27.29. 2. Left breast cancer, stage IA   Review entire medical history, diagnosis, and management of breast cancer.              Patient is s/p left mastectomy.  Exam reveals no evidence of recurrence  Continue Arimidex.  Continue with yearly right breast screening mammograms, next 05/2020. 3. Osteopenia   Bone density scan on 05/08/2018: T-score of -1.6 at forearm radius.   Continue on calcium and Vitamin D.   Next bone density scan in 04/2020. 4. RTC in 6 months for MD assessment and labs (CBC with diff, CMP, CA27.29).  I discussed the assessment and treatment plan with the patient.  The patient was provided an opportunity to ask questions and all were answered.  The patient agreed with the plan and demonstrated an understanding of the instructions.  The patient was advised to call back if the symptoms worsen or if the condition fails to improve as anticipated.   Lequita Asal, MD, PhD    08/03/2019, 9:02 AM  I, Molly Dorshimer, am acting as Education administrator for [employer]. Mike Gip, MD, PhD.  I, Harvir Patry C. Mike Gip, MD, have reviewed the above documentation for accuracy and completeness, and I agree with the above.

## 2019-08-03 ENCOUNTER — Other Ambulatory Visit: Payer: Self-pay

## 2019-08-03 ENCOUNTER — Inpatient Hospital Stay: Payer: Medicare Other | Attending: Hematology and Oncology | Admitting: Hematology and Oncology

## 2019-08-03 ENCOUNTER — Other Ambulatory Visit: Payer: Medicare Other

## 2019-08-03 ENCOUNTER — Encounter: Payer: Self-pay | Admitting: Hematology and Oncology

## 2019-08-03 VITALS — BP 117/65 | HR 62 | Temp 97.2°F | Resp 16 | Wt 179.9 lb

## 2019-08-03 DIAGNOSIS — M8580 Other specified disorders of bone density and structure, unspecified site: Secondary | ICD-10-CM | POA: Diagnosis not present

## 2019-08-03 DIAGNOSIS — Z9012 Acquired absence of left breast and nipple: Secondary | ICD-10-CM | POA: Diagnosis not present

## 2019-08-03 DIAGNOSIS — E785 Hyperlipidemia, unspecified: Secondary | ICD-10-CM | POA: Diagnosis not present

## 2019-08-03 DIAGNOSIS — C50512 Malignant neoplasm of lower-outer quadrant of left female breast: Secondary | ICD-10-CM

## 2019-08-03 DIAGNOSIS — Z79899 Other long term (current) drug therapy: Secondary | ICD-10-CM | POA: Insufficient documentation

## 2019-08-03 DIAGNOSIS — Z87891 Personal history of nicotine dependence: Secondary | ICD-10-CM | POA: Insufficient documentation

## 2019-08-03 DIAGNOSIS — M85839 Other specified disorders of bone density and structure, unspecified forearm: Secondary | ICD-10-CM

## 2019-08-03 DIAGNOSIS — Z79811 Long term (current) use of aromatase inhibitors: Secondary | ICD-10-CM | POA: Insufficient documentation

## 2019-08-03 DIAGNOSIS — C50912 Malignant neoplasm of unspecified site of left female breast: Secondary | ICD-10-CM | POA: Diagnosis present

## 2019-08-03 DIAGNOSIS — Z170 Estrogen receptor positive status [ER+]: Secondary | ICD-10-CM | POA: Diagnosis not present

## 2019-08-03 LAB — CBC WITH DIFFERENTIAL/PLATELET
Abs Immature Granulocytes: 0.03 10*3/uL (ref 0.00–0.07)
Basophils Absolute: 0.1 10*3/uL (ref 0.0–0.1)
Basophils Relative: 1 %
Eosinophils Absolute: 0.2 10*3/uL (ref 0.0–0.5)
Eosinophils Relative: 3 %
HCT: 40.4 % (ref 36.0–46.0)
Hemoglobin: 13.8 g/dL (ref 12.0–15.0)
Immature Granulocytes: 1 %
Lymphocytes Relative: 23 %
Lymphs Abs: 1.3 10*3/uL (ref 0.7–4.0)
MCH: 31.3 pg (ref 26.0–34.0)
MCHC: 34.2 g/dL (ref 30.0–36.0)
MCV: 91.6 fL (ref 80.0–100.0)
Monocytes Absolute: 0.5 10*3/uL (ref 0.1–1.0)
Monocytes Relative: 9 %
Neutro Abs: 3.6 10*3/uL (ref 1.7–7.7)
Neutrophils Relative %: 63 %
Platelets: 251 10*3/uL (ref 150–400)
RBC: 4.41 MIL/uL (ref 3.87–5.11)
RDW: 13.2 % (ref 11.5–15.5)
WBC: 5.6 10*3/uL (ref 4.0–10.5)
nRBC: 0 % (ref 0.0–0.2)

## 2019-08-03 LAB — COMPREHENSIVE METABOLIC PANEL
ALT: 13 U/L (ref 0–44)
AST: 18 U/L (ref 15–41)
Albumin: 4.5 g/dL (ref 3.5–5.0)
Alkaline Phosphatase: 56 U/L (ref 38–126)
Anion gap: 10 (ref 5–15)
BUN: 29 mg/dL — ABNORMAL HIGH (ref 8–23)
CO2: 28 mmol/L (ref 22–32)
Calcium: 9.6 mg/dL (ref 8.9–10.3)
Chloride: 100 mmol/L (ref 98–111)
Creatinine, Ser: 1.03 mg/dL — ABNORMAL HIGH (ref 0.44–1.00)
GFR calc Af Amer: >60 mL/min (ref >60)
GFR calc non Af Amer: 53 mL/min — ABNORMAL LOW (ref >60)
Glucose, Bld: 113 mg/dL — ABNORMAL HIGH (ref 70–99)
Potassium: 4.1 mmol/L (ref 3.5–5.1)
Sodium: 138 mmol/L (ref 135–145)
Total Bilirubin: 0.8 mg/dL (ref 0.3–1.2)
Total Protein: 7.5 g/dL (ref 6.5–8.1)

## 2019-08-03 MED ORDER — ANASTROZOLE 1 MG PO TABS: 1.0000 mg | ORAL_TABLET | Freq: Every day | ORAL | 3 refills | Status: DC

## 2019-08-03 NOTE — Progress Notes (Signed)
Pt here for follow up. Previous Dr. Rogue Bussing. Patient requesting refill on Arimidex. Denies any concerns.

## 2019-08-04 LAB — CANCER ANTIGEN 27.29: CA 27.29: 16.2 U/mL (ref 0.0–38.6)

## 2020-02-04 NOTE — Progress Notes (Signed)
This encounter was created in error - please disregard.

## 2020-02-06 ENCOUNTER — Inpatient Hospital Stay: Payer: 59

## 2020-02-06 ENCOUNTER — Inpatient Hospital Stay: Payer: 59 | Attending: Hematology and Oncology | Admitting: Hematology and Oncology

## 2020-02-18 ENCOUNTER — Encounter: Payer: Self-pay | Admitting: Hematology and Oncology

## 2020-04-14 ENCOUNTER — Other Ambulatory Visit: Payer: Self-pay

## 2020-04-14 DIAGNOSIS — Z170 Estrogen receptor positive status [ER+]: Secondary | ICD-10-CM

## 2020-04-14 DIAGNOSIS — C50512 Malignant neoplasm of lower-outer quadrant of left female breast: Secondary | ICD-10-CM

## 2020-04-14 DIAGNOSIS — Z1231 Encounter for screening mammogram for malignant neoplasm of breast: Secondary | ICD-10-CM

## 2020-04-23 ENCOUNTER — Other Ambulatory Visit: Payer: Self-pay | Admitting: Family Medicine

## 2020-04-23 DIAGNOSIS — Z1231 Encounter for screening mammogram for malignant neoplasm of breast: Secondary | ICD-10-CM

## 2020-05-29 ENCOUNTER — Ambulatory Visit
Admission: RE | Admit: 2020-05-29 | Discharge: 2020-05-29 | Disposition: A | Discharge status: Home / Self Care | Payer: Medicare Other | Source: Ambulatory Visit | Attending: Family Medicine | Admitting: Family Medicine

## 2020-05-29 DIAGNOSIS — Z1231 Encounter for screening mammogram for malignant neoplasm of breast: Secondary | ICD-10-CM | POA: Diagnosis present

## 2020-06-09 ENCOUNTER — Ambulatory Visit: Payer: Medicare Other | Admitting: Surgery

## 2020-09-01 ENCOUNTER — Encounter: Payer: Self-pay | Admitting: *Deleted

## 2021-05-26 ENCOUNTER — Ambulatory Visit
Admission: EM | Admit: 2021-05-26 | Discharge: 2021-05-26 | Disposition: A | Discharge status: Home / Self Care | Payer: Medicare Other | Attending: Family Medicine | Admitting: Family Medicine

## 2021-05-26 ENCOUNTER — Encounter: Payer: Self-pay | Admitting: Emergency Medicine

## 2021-05-26 ENCOUNTER — Other Ambulatory Visit: Payer: Self-pay

## 2021-05-26 ENCOUNTER — Encounter: Payer: Medicare Other | Admitting: Family Medicine

## 2021-05-26 DIAGNOSIS — M79652 Pain in left thigh: Secondary | ICD-10-CM

## 2021-05-26 MED ORDER — DICLOFENAC SODIUM 1 % EX GEL: 4.0000 g | Freq: Four times a day (QID) | CUTANEOUS | 0 refills | Status: DC | PRN

## 2021-05-26 NOTE — ED Provider Notes (Signed)
[facility]    CSN: [id number] Arrival date & time: 05/26/21  1022      History   Chief Complaint Chief Complaint  Patient presents with  . Leg Pain    Left thigh   HPI  80 year old female presents with the above complaint  1 week history of left lateral thigh pain.  She states that it is intermittent during the day.  Worse at night.  Patient states that she has previously felt a "knot" in the area.  She is physically active.  She plays golf 1-2 times a week.  She walks regularly.  She denies any fall, trauma, injury.  No increase in her physical activity level.  She has tried Tylenol and a topical agent as well as Aleve without resolution.  No other reported symptoms.  No other complaints.  Past Medical History:  Diagnosis Date  . Breast cancer ([place])   . Hyperlipidemia   . Motion sickness    ships  . PONV (postoperative nausea and vomiting)   . Vertigo     Patient Active Problem List   Diagnosis Date Noted  . Osteopenia of forearm 08/03/2019  . 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 HLD (hyperlipidemia) 04/01/2016  . Episodic recurrent vertigo 04/30/2015  . 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    Past Surgical History:  Procedure Laterality Date  . ABDOMINAL HYSTERECTOMY    . BREAST BIOPSY Left 06/09/2016   left [place] core bx, [facility]  . BREAST CYST ASPIRATION Left    neg  . BROW LIFT Bilateral 09/06/2017   Procedure: BLEPHAROPLASTY;  Surgeon: Karle Starch, MD;  Location: [place] [facility];  Service: Ophthalmology;  Laterality: Bilateral;  MAC  . MASTECTOMY Left 07/17/2016   IM[facility], negative LN  . MASTECTOMY W/ SENTINEL NODE BIOPSY Left 07/16/2016   Procedure: MASTECTOMY WITH SENTINEL LYMPH NODE BIOPSY;  Surgeon: Hubbard Robinson, MD;  Location: [facility];  Service: General;  Laterality: Left;  . PILONIDAL CYST / SINUS EXCISION    . TOE SURGERY      OB History   No  obstetric history on file.      Home Medications    Prior to Admission medications   Medication Sig Start Date End Date Taking? Authorizing Provider  Calcium 600-200 MG-UNIT tablet Take 1 tablet by mouth 2 (two) times daily.   Yes [provider]  diclofenac Sodium (VOLTAREN) 1 % GEL Apply 4 g topically 4 (four) times daily as needed. 05/26/21  Yes Coral Spikes, DO  Multiple Vitamin (MULTIVITAMIN) capsule Take 1 capsule by mouth daily.    Yes [provider]  triamterene-hydrochlorothiazide (DYAZIDE) 37.5-25 MG capsule Take 1 capsule by mouth daily.  05/25/16 05/03/37 Yes [provider]    Family History Family History  Problem Relation Age of Onset  . Cancer Mother 60       Liver  . Breast cancer Neg Hx     Social History Social History   Tobacco Use  . Smoking status: Former Research scientist (life sciences)  . Smokeless tobacco: Never Used  . Tobacco comment: while in early 78s  Vaping Use  . Vaping Use: Never used  Substance Use Topics  . Alcohol use: Yes    Alcohol/week: 0.0 standard drinks    Comment: 1 Scotch Daily  . Drug use: No     Allergies   Patient has no known allergies.   Review of Systems Review of Systems Per HPI  Physical Exam Triage Vital Signs ED Triage Vitals [05/26/21 1032]  Enc Vitals Group     BP 115/64     Pulse Rate 72     Resp 18     Temp 97.7 F (36.5 C)     Temp Source Oral     SpO2 100 %     Weight 179 lb 14.3 oz (81.6 kg)     Height 5' 8.5" (1.74 m)     Head Circumference      Peak Flow      Pain Score 0     Pain Loc      Pain Edu?      Excl. in [place]?    Updated Vital Signs BP 115/64 (BP Location: Right Arm)   Pulse 72   Temp 97.7 F (36.5 C) (Oral)   Resp 18   Ht 5' 8.5" (1.74 m)   Wt 81.6 kg   SpO2 100%   BMI 26.96 kg/m   Visual Acuity Right Eye Distance:   Left Eye Distance:   Bilateral Distance:    Right Eye Near:   Left Eye Near:    Bilateral Near:     Physical Exam Constitutional:      General:  She is not in acute distress.    Appearance: Normal appearance.  HENT:     Head: Normocephalic and atraumatic.  Eyes:     General:        Right eye: No discharge.        Left eye: No discharge.     Conjunctiva/sclera: Conjunctivae normal.  Pulmonary:     Effort: Pulmonary effort is normal. No respiratory distress.  Musculoskeletal:     Comments: Patient with a discrete area of tenderness over the lateral thigh.  Appears to be over the distal IT band.  Neurological:     Mental Status: She is alert.  Psychiatric:        Mood and Affect: Mood normal.        Behavior: Behavior normal.    UC Treatments / Results  Labs (all labs ordered are listed, but only abnormal results are displayed) Labs Reviewed - No data to display  EKG   Radiology No results found.  Procedures Procedures (including critical care time)  Medications Ordered in UC Medications - No data to display  Initial Impression / Assessment and Plan / UC Course  I have reviewed the triage vital signs and the nursing notes.  Pertinent labs & imaging results that were available during my care of the patient were reviewed by me and considered in my medical decision making (see chart for details).    80 year old female presents with lateral thigh pain.  Sending to Dr. Zigmund Daniel for evaluation and ultrasound.  Topical diclofenac as prescribed.   Final Clinical Impressions(s) / UC Diagnoses   Final diagnoses:  Acute pain of left thigh     Discharge Instructions     See Dr. Zigmund Daniel at 140 today (he'll Ultrasound the area).  Medication as prescribed.  Take care  Dr. Lacinda Axon    ED Prescriptions    Medication Sig Dispense Auth. Provider   diclofenac Sodium (VOLTAREN) 1 % GEL Apply 4 g topically 4 (four) times daily as needed. 50 g Coral Spikes, DO     PDMP not reviewed this encounter.   Coral Spikes, [place] 05/26/21 1115

## 2021-05-26 NOTE — Discharge Instructions (Signed)
See Dr. Zigmund Daniel at 140 today (he'll Ultrasound the area).  Medication as prescribed.  Take care  Dr. Lacinda Axon

## 2021-05-26 NOTE — ED Triage Notes (Signed)
Pt c/o left thigh pain. She states it is intermittent and sometimes she can feel a knot in the area. She states the pain is worse when she lays down for bed. Started about a week ago.

## 2021-05-27 ENCOUNTER — Encounter: Payer: Self-pay | Admitting: Family Medicine

## 2021-05-27 ENCOUNTER — Ambulatory Visit (INDEPENDENT_AMBULATORY_CARE_PROVIDER_SITE_OTHER): Payer: Medicare Other | Admitting: Family Medicine

## 2021-05-27 VITALS — BP 104/68 | HR 73 | Temp 97.6°F | Ht 68.5 in | Wt 186.0 lb

## 2021-05-27 DIAGNOSIS — M6289 Other specified disorders of muscle: Secondary | ICD-10-CM | POA: Diagnosis not present

## 2021-05-27 MED ORDER — TRIAMCINOLONE ACETONIDE 40 MG/ML IJ SUSP
40.0000 mg | Freq: Once | INTRAMUSCULAR | Status: AC
  Administered 2021-05-27: 40 mg

## 2021-05-27 MED ORDER — TRIAMCINOLONE ACETONIDE 40 MG/ML IJ SUSP: 40.0000 mg | Freq: Once | INTRAMUSCULAR | Status: DC

## 2021-05-27 NOTE — Progress Notes (Signed)
Primary Care / Sports Medicine Office Visit  Patient Information:  Patient ID: Melanie Simmons, female DOB: 22-Aug-1941 Age: 80 y.o. MRN: [id number]   Melanie Simmons is a pleasant 80 y.o. 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   Osteopenia of left forearm 07/05/2018   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   diclofenac Sodium (VOLTAREN) 1 % GEL Apply 4 g topically 4 (four) times daily as needed.   Multiple Vitamin (MULTIVITAMIN) capsule Take 1 capsule by mouth daily.    triamterene-hydrochlorothiazide (DYAZIDE) 37.5-25 MG capsule Take 1 capsule by mouth daily.    [EXPIRED] triamcinolone acetonide (KENALOG-40) injection 40 mg    [DISCONTINUED] triamcinolone acetonide (KENALOG-40) injection 40 mg    No facility-administered encounter medications on file as of 05/27/2021.   Past Surgical History:  Procedure Laterality Date    ABDOMINAL HYSTERECTOMY     BREAST BIOPSY Left 06/09/2016   left [place] core bx, [facility]   BREAST CYST ASPIRATION Left    neg   BROW LIFT Bilateral 09/06/2017   Procedure: BLEPHAROPLASTY;  Surgeon: Karle Starch, MD;  Location: [place];  Service: Ophthalmology;  Laterality: Bilateral;  MAC   MASTECTOMY Left 07/17/2016   [facility], negative LN   MASTECTOMY W/ SENTINEL NODE BIOPSY Left 07/16/2016   Procedure: MASTECTOMY WITH SENTINEL LYMPH NODE BIOPSY;  Surgeon: Hubbard Robinson, MD;  Location: [facility];  Service: General;  Laterality: Left;   PILONIDAL CYST / SINUS EXCISION     TOE SURGERY      Vitals:   05/27/21 1115 05/27/21 1154  BP: (!) 92/58 104/68  Pulse: 73   Temp: 97.6 F (36.4 C)   SpO2: 97%    Vitals:   05/27/21 1115  Weight: 186 lb (84.4 kg)  Height: 5' 8.5" (1.74 m)   Body mass index is 27.87 kg/m.  No results found.   Independent interpretation of notes and tests performed by another provider:   None  Procedures performed:   Procedure:  Injection of space superior and inferior to the tensor fascia lata/IT band on the left under ultrasound guidance. Ultrasound guidance utilized for needle placement in various tissue planes, thickening of the visualized IT band tissue noted Samsung HS60 device utilized with permanent recording / reporting. Consent obtained and verified. Noted no overlying erythema, induration, or other signs of local infection. Skin prepped in a  sterile fashion. Ethyl chloride spray for topical local analgesia.  Completed without difficulty and tolerated well. Medication: triamcinolone acetonide 40 mg/mL suspension for injection 4 mL total and 4 mL lidocaine 1% without epinephrine utilized for needle placement anesthetic Advised to contact for fevers/chills, erythema, induration, drainage, or persistent bleeding.  Pertinent History, Exam, Impression, and Recommendations:   Tensor fascia lata syndrome Patient with atraumatic left lateral  thigh severe pain since 05/18/2021.  She denies any specific activity change or trauma but does recall excessive prolonged sitting for a pretreatment proceeding the onset of symptoms.  Pain is focal to the lateral upper-mid thigh, mild radiation proximally, denies any radiation distally or to the foot/ankle, no paresthesias, no weakness.  Pain noted with ambulation but in particular severe pain at nighttime with laying on either side of her hips.  She denies any similar episodes in the past treatments to date have included topical anti-inflammatory diclofenac 1%, this has allowed pain to be tolerable throughout the day but nighttime pain persists.  Physical exam findings reveal focal tenderness to the greater trochanter, maximal tenderness at the tensor fascia lata that recreates her symptoms, positive Ober testing, equivocal FABER/FADIR that localize to the lateral thigh at the area of pain, straight leg raise testing is negative.  Given the chronicity of symptoms, treatments to date, I did review treatment options inclusive of further medication management and recheck in 2 weeks, local injection.  She did opt for corticosteroid injection today, we discussed the nature of triamcinolone, onset of action and associated timeline for symptom response.  Home-based rehab exercises were provided to the patient today for her to begin next week and focus on gentle progression.  If symptoms fail to adequately improve at the 2-4-week mark, she was advised to contact her office for further instructions.  If noted, can consider adjunct oral pharmacotherapy, formal physical therapy, imaging.    Orders & Medications Meds ordered this encounter  Medications   DISCONTD: triamcinolone acetonide (KENALOG-40) injection 40 mg   triamcinolone acetonide (KENALOG-40) injection 40 mg   No orders of the defined types were placed in this encounter.    No follow-ups on file.     Montel Culver, MD   Primary Care Sports  Medicine [place]

## 2021-05-27 NOTE — Patient Instructions (Addendum)
You have just been given a cortisone injection to reduce pain and inflammation. After the injection you may notice immediate relief of pain as a result of the Lidocaine. It is important to rest the area of the injection for 24 to 48 hours after the injection. There is a possibility of some temporary increased discomfort and swelling for up to 72 hours until the cortisone begins to work. If you do have pain, simply rest the joint and use ice. If you can tolerate over the counter medications, you can try Tylenol, Aleve, or Advil for added relief per package instructions.  - Start home exercises with information provided next week and focus on slow and steady advance - Hold from topical medication at the injection site, can utilize ice, oral medications for pain on an as-needed basis - If symptoms fail to adequately improve at the 2-4-week mark, contact your office to discuss next steps

## 2021-05-27 NOTE — Assessment & Plan Note (Addendum)
Patient with atraumatic left lateral thigh severe pain since 05/18/2021.  She denies any specific activity change or trauma but does recall excessive prolonged sitting for a pretreatment proceeding the onset of symptoms.  Pain is focal to the lateral upper-mid thigh, mild radiation proximally, denies any radiation distally or to the foot/ankle, no paresthesias, no weakness.  Pain noted with ambulation but in particular severe pain at nighttime with laying on either side of her hips.  She denies any similar episodes in the past treatments to date have included topical anti-inflammatory diclofenac 1%, this has allowed pain to be tolerable throughout the day but nighttime pain persists.  Physical exam findings reveal focal tenderness to the greater trochanter, maximal tenderness at the tensor fascia lata that recreates her symptoms, positive Ober testing, equivocal FABER/FADIR that localize to the lateral thigh at the area of pain, straight leg raise testing is negative.  Given the chronicity of symptoms, treatments to date, I did review treatment options inclusive of further medication management and recheck in 2 weeks, local injection.  She did opt for corticosteroid injection today, we discussed the nature of triamcinolone, onset of action and associated timeline for symptom response.  Home-based rehab exercises were provided to the patient today for her to begin next week and focus on gentle progression.  If symptoms fail to adequately improve at the 2-4-week mark, she was advised to contact her office for further instructions.  If noted, can consider adjunct oral pharmacotherapy, formal physical therapy, imaging.

## 2021-05-28 ENCOUNTER — Telehealth: Payer: Self-pay

## 2021-05-28 NOTE — Telephone Encounter (Unsigned)
Copied from [place] [id number]. Topic: General - Other >> May 28, 2021  3:13 PM Alanda Slim E wrote: Reason for CRM:Pt called to let Dr. Zigmund Daniel know that the Cortizone shot worked great/ and thank you!

## 2021-05-29 NOTE — Telephone Encounter (Signed)
Patient notified and verbalized understanding.  No further questions at this time.

## 2021-07-03 ENCOUNTER — Other Ambulatory Visit: Payer: Self-pay | Admitting: *Deleted

## 2021-07-03 ENCOUNTER — Other Ambulatory Visit: Payer: Self-pay | Admitting: Family Medicine

## 2021-07-03 DIAGNOSIS — C50512 Malignant neoplasm of lower-outer quadrant of left female breast: Secondary | ICD-10-CM

## 2021-07-03 DIAGNOSIS — M85839 Other specified disorders of bone density and structure, unspecified forearm: Secondary | ICD-10-CM

## 2021-07-03 DIAGNOSIS — Z1231 Encounter for screening mammogram for malignant neoplasm of breast: Secondary | ICD-10-CM

## 2021-07-03 DIAGNOSIS — Z170 Estrogen receptor positive status [ER+]: Secondary | ICD-10-CM

## 2021-07-07 ENCOUNTER — Inpatient Hospital Stay: Payer: Medicare Other

## 2021-07-07 ENCOUNTER — Other Ambulatory Visit: Payer: Self-pay

## 2021-07-07 ENCOUNTER — Inpatient Hospital Stay: Payer: Medicare Other | Attending: Internal Medicine | Admitting: Internal Medicine

## 2021-07-07 ENCOUNTER — Encounter: Payer: Self-pay | Admitting: Internal Medicine

## 2021-07-07 DIAGNOSIS — Z170 Estrogen receptor positive status [ER+]: Secondary | ICD-10-CM

## 2021-07-07 DIAGNOSIS — Z9012 Acquired absence of left breast and nipple: Secondary | ICD-10-CM | POA: Diagnosis not present

## 2021-07-07 DIAGNOSIS — E785 Hyperlipidemia, unspecified: Secondary | ICD-10-CM | POA: Insufficient documentation

## 2021-07-07 DIAGNOSIS — C50512 Malignant neoplasm of lower-outer quadrant of left female breast: Secondary | ICD-10-CM | POA: Diagnosis not present

## 2021-07-07 DIAGNOSIS — Z79811 Long term (current) use of aromatase inhibitors: Secondary | ICD-10-CM | POA: Insufficient documentation

## 2021-07-07 DIAGNOSIS — M8580 Other specified disorders of bone density and structure, unspecified site: Secondary | ICD-10-CM | POA: Insufficient documentation

## 2021-07-07 DIAGNOSIS — Z90710 Acquired absence of both cervix and uterus: Secondary | ICD-10-CM | POA: Insufficient documentation

## 2021-07-07 DIAGNOSIS — I10 Essential (primary) hypertension: Secondary | ICD-10-CM | POA: Insufficient documentation

## 2021-07-07 DIAGNOSIS — Z79899 Other long term (current) drug therapy: Secondary | ICD-10-CM | POA: Diagnosis not present

## 2021-07-07 DIAGNOSIS — M85839 Other specified disorders of bone density and structure, unspecified forearm: Secondary | ICD-10-CM

## 2021-07-07 LAB — COMPREHENSIVE METABOLIC PANEL
ALT: 16 U/L (ref 0–44)
AST: 17 U/L (ref 15–41)
Albumin: 4.4 g/dL (ref 3.5–5.0)
Alkaline Phosphatase: 60 U/L (ref 38–126)
Anion gap: 7 (ref 5–15)
BUN: 26 mg/dL — ABNORMAL HIGH (ref 8–23)
CO2: 29 mmol/L (ref 22–32)
Calcium: 9.5 mg/dL (ref 8.9–10.3)
Chloride: 101 mmol/L (ref 98–111)
Creatinine, Ser: 1.07 mg/dL — ABNORMAL HIGH (ref 0.44–1.00)
GFR, Estimated: 53 mL/min — ABNORMAL LOW (ref >60)
Glucose, Bld: 102 mg/dL — ABNORMAL HIGH (ref 70–99)
Potassium: 3.4 mmol/L — ABNORMAL LOW (ref 3.5–5.1)
Sodium: 137 mmol/L (ref 135–145)
Total Bilirubin: 0.9 mg/dL (ref 0.3–1.2)
Total Protein: 7.3 g/dL (ref 6.5–8.1)

## 2021-07-07 LAB — CBC WITH DIFFERENTIAL/PLATELET
Abs Immature Granulocytes: 0.19 10*3/uL — ABNORMAL HIGH (ref 0.00–0.07)
Basophils Absolute: 0.1 10*3/uL (ref 0.0–0.1)
Basophils Relative: 1 %
Eosinophils Absolute: 0.1 10*3/uL (ref 0.0–0.5)
Eosinophils Relative: 1 %
HCT: 40.1 % (ref 36.0–46.0)
Hemoglobin: 13.6 g/dL (ref 12.0–15.0)
Immature Granulocytes: 2 %
Lymphocytes Relative: 13 %
Lymphs Abs: 1.1 10*3/uL (ref 0.7–4.0)
MCH: 30.8 pg (ref 26.0–34.0)
MCHC: 33.9 g/dL (ref 30.0–36.0)
MCV: 90.7 fL (ref 80.0–100.0)
Monocytes Absolute: 0.7 10*3/uL (ref 0.1–1.0)
Monocytes Relative: 9 %
Neutro Abs: 6.2 10*3/uL (ref 1.7–7.7)
Neutrophils Relative %: 74 %
Platelets: 279 10*3/uL (ref 150–400)
RBC: 4.42 MIL/uL (ref 3.87–5.11)
RDW: 14.2 % (ref 11.5–15.5)
WBC: 8.4 10*3/uL (ref 4.0–10.5)
nRBC: 0 % (ref 0.0–0.2)

## 2021-07-07 NOTE — Progress Notes (Signed)
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, estrogen receptor positive ([place]) Staging form: Breast, AJCC 7th Edition - Clinical stage from 06/18/2016: Stage IA (T1c, N0, M0) - Signed by Lloyd Huger, MD on 06/18/2016 Laterality: Left Estrogen receptor status: Positive Progesterone receptor status: Positive HER2 status: Negative    Oncology History Overview Note  # 2017-STAGE I; BREAST CANCER LEFT s/p Mastec; ER/PR POS; her 2 NEG; Oncotype LOW risk;  Arimidex  # BMD May 2019- Osteopenia/ ca+ Vit D   DIAGNOSIS: BREAST CA  STAGE:     I    ;GOALS: curative  CURRENT/MOST RECENT THERAPY : arimidex    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  female pleasant patient above history of stage I breast cancer currently on Arimidex is here for follow-up.  Patient denies any nausea vomiting abdominal pain.  Denies any worsening pain.  Denies any cough.  Denies hot flashes.  She states to be  No swelling the legs.  No nausea no vomiting.  Review of Systems  Constitutional:  Negative for chills, diaphoresis, fever, malaise/fatigue and weight loss.  HENT:  Negative for nosebleeds and sore throat.   Eyes:  Negative for double vision.  Respiratory:  Negative for cough, hemoptysis, sputum production, shortness of breath and wheezing.   Cardiovascular:  Negative for chest pain, palpitations, orthopnea and leg swelling.  Gastrointestinal:  Negative for abdominal pain, blood in stool, constipation, diarrhea, heartburn, melena, nausea and vomiting.  Genitourinary:  Negative for dysuria, frequency and urgency.  Musculoskeletal:   Negative for back pain and joint pain.  Skin: Negative.  Negative for itching and rash.  Neurological:  Negative for dizziness, tingling, focal weakness, weakness and headaches.  Endo/Heme/Allergies:  Does not bruise/bleed easily.  Psychiatric/Behavioral:  Negative for depression. The patient is not nervous/anxious and does not have insomnia.      PAST MEDICAL HISTORY :  Past Medical History:  Diagnosis Date  . Breast cancer ([place])   . Hyperlipidemia   . Hypertension   . Motion sickness    ships  . PONV (postoperative nausea and vomiting)   . Vertigo     PAST SURGICAL HISTORY :   Past Surgical History:  Procedure Laterality Date  . ABDOMINAL HYSTERECTOMY    . BREAST BIOPSY Left 06/09/2016   left [place] core bx, [facility]  . BREAST CYST ASPIRATION Left    neg  . BROW LIFT Bilateral 09/06/2017   Procedure: BLEPHAROPLASTY;  Surgeon: Karle Starch, MD;  Location: [place];  Service: Ophthalmology;  Laterality: Bilateral;  MAC  . MASTECTOMY Left 07/17/2016   [facility], negative LN  . MASTECTOMY W/ SENTINEL NODE BIOPSY Left 07/16/2016   Procedure: MASTECTOMY WITH SENTINEL LYMPH NODE BIOPSY;  Surgeon: Hubbard Robinson, MD;  Location: [facility];  Service: General;  Laterality: Left;  . PILONIDAL CYST / SINUS EXCISION    . TOE SURGERY      FAMILY HISTORY :   Family History  Problem Relation Age of Onset  . Cancer Mother 51       Liver  . Breast cancer Neg Hx     SOCIAL HISTORY:   Social  History   Tobacco Use  . Smoking status: Former  . Smokeless tobacco: Never  Vaping Use  . Vaping Use: Never used  Substance Use Topics  . Alcohol use: Yes    Alcohol/week: 7.0 standard drinks    Types: 7 Shots of liquor per week  . Drug use: No    ALLERGIES:  has No Known Allergies.  MEDICATIONS:  Current Outpatient Medications  Medication Sig Dispense Refill  . Calcium 600-200 MG-UNIT tablet Take 1 tablet by mouth 2 (two) times daily.    . diclofenac Sodium (VOLTAREN) 1 % GEL Apply 4  g topically 4 (four) times daily as needed. 50 g 0  . Multiple Vitamin (MULTIVITAMIN) capsule Take 1 capsule by mouth daily.     Marland Kitchen triamterene-hydrochlorothiazide (DYAZIDE) 37.5-25 MG capsule Take 1 capsule by mouth daily.      No current facility-administered medications for this visit.    PHYSICAL EXAMINATION: ECOG PERFORMANCE STATUS: 0 - Asymptomatic  BP 120/74 (BP Location: Right Arm, Patient Position: Sitting, Cuff Size: Normal)   Pulse 70   Temp (!) 97.1 F (36.2 C) (Tympanic)   Resp 16   Ht 5' 8.5" (1.74 m)   Wt 183 lb 5 oz (83.2 kg)   SpO2 98%   BMI 27.47 kg/m   Filed Weights   07/07/21 1507  Weight: 183 lb 5 oz (83.2 kg)   Physical Exam HENT:     Head: Normocephalic and atraumatic.     Mouth/Throat:     Pharynx: No oropharyngeal exudate.  Eyes:     Pupils: Pupils are equal, round, and reactive to light.  Cardiovascular:     Rate and Rhythm: Normal rate and regular rhythm.     Heart sounds: Murmur heard.  Pulmonary:     Effort: No respiratory distress.     Breath sounds: No wheezing.  Abdominal:     General: Bowel sounds are normal. There is no distension.     Palpations: Abdomen is soft. There is no mass.     Tenderness: no abdominal tenderness There is no guarding or rebound.  Musculoskeletal:        General: No tenderness. Normal range of motion.     Cervical back: Normal range of motion and neck supple.  Skin:    General: Skin is warm.     Comments:    Neurological:     Mental Status: She is alert and oriented to person, place, and time.  Psychiatric:        Mood and Affect: Affect normal.      LABORATORY DATA:  I have reviewed the data as listed    Component Value Date/Time   NA 137 07/07/2021 1503   NA 142 09/11/2012 2242   K 3.4 (L) 07/07/2021 1503   K 3.8 09/11/2012 2242   CL 101 07/07/2021 1503   CL 104 09/11/2012 2242   CO2 29 07/07/2021 1503   CO2 29 09/11/2012 2242   GLUCOSE 102 (H) 07/07/2021 1503   GLUCOSE 94 09/11/2012 2242    BUN 26 (H) 07/07/2021 1503   BUN 27 (H) 09/11/2012 2242   CREATININE 1.07 (H) 07/07/2021 1503   CREATININE 0.80 09/11/2012 2242   CALCIUM 9.5 07/07/2021 1503   CALCIUM 9.7 09/11/2012 2242   PROT 7.3 07/07/2021 1503   PROT 7.5 09/11/2012 2242   ALBUMIN 4.4 07/07/2021 1503   ALBUMIN 4.4 09/11/2012 2242   AST 17 07/07/2021 1503   AST 19 09/11/2012 2242   ALT 16 07/07/2021 1503  ALT 19 09/11/2012 2242   ALKPHOS 60 07/07/2021 1503   ALKPHOS 72 09/11/2012 2242   BILITOT 0.9 07/07/2021 1503   BILITOT 0.4 09/11/2012 2242   GFRNONAA 53 (L) 07/07/2021 1503   GFRNONAA >60 09/11/2012 2242   GFRAA >60 08/03/2019 0933   GFRAA >60 09/11/2012 2242    No results found for: SPEP, UPEP  Lab Results  Component Value Date   WBC 8.4 07/07/2021   NEUTROABS 6.2 07/07/2021   HGB 13.6 07/07/2021   HCT 40.1 07/07/2021   MCV 90.7 07/07/2021   PLT 279 07/07/2021      Chemistry      Component Value Date/Time   NA 137 07/07/2021 1503   NA 142 09/11/2012 2242   K 3.4 (L) 07/07/2021 1503   K 3.8 09/11/2012 2242   CL 101 07/07/2021 1503   CL 104 09/11/2012 2242   CO2 29 07/07/2021 1503   CO2 29 09/11/2012 2242   BUN 26 (H) 07/07/2021 1503   BUN 27 (H) 09/11/2012 2242   CREATININE 1.07 (H) 07/07/2021 1503   CREATININE 0.80 09/11/2012 2242      Component Value Date/Time   CALCIUM 9.5 07/07/2021 1503   CALCIUM 9.7 09/11/2012 2242   ALKPHOS 60 07/07/2021 1503   ALKPHOS 72 09/11/2012 2242   AST 17 07/07/2021 1503   AST 19 09/11/2012 2242   ALT 16 07/07/2021 1503   ALT 19 09/11/2012 2242   BILITOT 0.9 07/07/2021 1503   BILITOT 0.4 09/11/2012 2242       RADIOGRAPHIC STUDIES: I have personally reviewed the radiological images as listed and agreed with the findings in the report. No results found.   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  No evidence of any recurrence noted.;  Stable.  Mammogram June 2021 within normal limits.  Defer further imaging to PCP/breast exam and annual basis.  # Heart murmur [new]-no evidence of any cardiovascular decompensation.  Recommend evaluation with PCP/EKG 2D echo etc.  Reminded importance-patient stated she will follow-up.  # BMD- May 2019;  Osteopenia: T score= -1.6; stable;  on Ca+ Vit D; defre to BMD to PCP.    #With regards to follow-up-patient is low risk of recurrent malignancy.  I would recommend yearly mammograms and yearly breast exam.  Patient feels comfortable following up with her PCP.  She will call us if she has any concerns from oncology standpoint.  # DISPOSITION: # follow up as needed--Dr.B  Cc; Dr.Aldridge   No orders of the defined types were placed in this encounter.  All questions were answered. The patient knows to call the clinic with any problems, questions or concerns.      Cammie Sickle, MD 07/12/2021 5:51 PM

## 2021-07-07 NOTE — Progress Notes (Signed)
Wants to know if she needs to be back on Arimidex. Has been off of it for 6 months.

## 2021-07-07 NOTE — Assessment & Plan Note (Addendum)
Stage I ER/PR positive HER-2/neu negative breast cancer-finished anastrozole 5 years 2022.  No evidence of any recurrence noted.;  Stable.  Mammogram June 2021 within normal limits.  Defer further imaging to PCP/breast exam and annual basis.  # Heart murmur [new]-no evidence of any cardiovascular decompensation.  Recommend evaluation with PCP/EKG 2D echo etc.  Reminded importance-patient stated she will follow-up.  # BMD- May 2019;  Osteopenia: T score= -1.6; stable;  on Ca+ Vit D; defre to BMD to PCP.    #With regards to follow-up-patient is low risk of recurrent malignancy.  I would recommend yearly mammograms and yearly breast exam.  Patient feels comfortable following up with her PCP.  She will call us if she has any concerns from oncology standpoint.  # DISPOSITION: # follow up as needed--Dr.B  Cc; Dr.Aldridge

## 2021-07-08 LAB — CANCER ANTIGEN 27.29: CA 27.29: 14 U/mL (ref 0.0–38.6)

## 2021-07-16 ENCOUNTER — Other Ambulatory Visit: Payer: Self-pay | Admitting: Family Medicine

## 2021-07-16 DIAGNOSIS — Z79811 Long term (current) use of aromatase inhibitors: Secondary | ICD-10-CM

## 2021-07-16 DIAGNOSIS — M85832 Other specified disorders of bone density and structure, left forearm: Secondary | ICD-10-CM

## 2021-07-27 ENCOUNTER — Other Ambulatory Visit: Payer: Self-pay

## 2021-07-27 ENCOUNTER — Ambulatory Visit
Admission: RE | Admit: 2021-07-27 | Discharge: 2021-07-27 | Disposition: A | Discharge status: Home / Self Care | Payer: Medicare Other | Source: Ambulatory Visit | Attending: Family Medicine | Admitting: Family Medicine

## 2021-07-27 DIAGNOSIS — Z1231 Encounter for screening mammogram for malignant neoplasm of breast: Secondary | ICD-10-CM | POA: Diagnosis present

## 2021-08-05 DIAGNOSIS — I10 Essential (primary) hypertension: Secondary | ICD-10-CM | POA: Insufficient documentation

## 2021-10-14 ENCOUNTER — Ambulatory Visit
Admission: RE | Admit: 2021-10-14 | Discharge: 2021-10-14 | Disposition: A | Discharge status: Home / Self Care | Payer: Medicare Other | Attending: Internal Medicine | Admitting: Internal Medicine

## 2021-10-14 ENCOUNTER — Encounter
Admission: RE | Disposition: A | Discharge status: Home / Self Care | Payer: Self-pay | Source: Home / Self Care | Attending: Internal Medicine

## 2021-10-14 ENCOUNTER — Encounter: Payer: Self-pay | Admitting: Internal Medicine

## 2021-10-14 ENCOUNTER — Other Ambulatory Visit: Payer: Self-pay

## 2021-10-14 ENCOUNTER — Ambulatory Visit
Admission: RE | Admit: 2021-10-14 | Discharge: 2021-10-14 | Disposition: A | Discharge status: Home / Self Care | Payer: Medicare Other | Source: Home / Self Care | Attending: Internal Medicine | Admitting: Internal Medicine

## 2021-10-14 DIAGNOSIS — I341 Nonrheumatic mitral (valve) prolapse: Secondary | ICD-10-CM | POA: Diagnosis not present

## 2021-10-14 DIAGNOSIS — I38 Endocarditis, valve unspecified: Secondary | ICD-10-CM | POA: Diagnosis present

## 2021-10-14 HISTORY — PX: TEE WITHOUT CARDIOVERSION: SHX5443

## 2021-10-14 SURGERY — ECHOCARDIOGRAM, TRANSESOPHAGEAL
Anesthesia: Moderate Sedation

## 2021-10-14 MED ORDER — LIDOCAINE VISCOUS HCL 2 % MT SOLN
OROMUCOSAL | Status: AC
  Filled 2021-10-14: qty 15

## 2021-10-14 MED ORDER — MIDAZOLAM HCL 2 MG/2ML IJ SOLN
INTRAMUSCULAR | Status: AC | PRN
  Administered 2021-10-14: 1 mg via INTRAVENOUS
  Administered 2021-10-14: 2 mg via INTRAVENOUS

## 2021-10-14 MED ORDER — SODIUM CHLORIDE 0.9 % IV SOLN: INTRAVENOUS | Status: DC

## 2021-10-14 MED ORDER — SODIUM CHLORIDE FLUSH 0.9 % IV SOLN
INTRAVENOUS | Status: AC
  Filled 2021-10-14: qty 10

## 2021-10-14 MED ORDER — LIDOCAINE VISCOUS HCL 2 % MT SOLN
OROMUCOSAL | Status: DC | PRN
  Administered 2021-10-14: 15 mL via OROMUCOSAL

## 2021-10-14 MED ORDER — BUTAMBEN-TETRACAINE-BENZOCAINE 2-2-14 % EX AERO
INHALATION_SPRAY | CUTANEOUS | Status: DC | PRN
  Administered 2021-10-14: 1 via TOPICAL

## 2021-10-14 MED ORDER — BUTAMBEN-TETRACAINE-BENZOCAINE 2-2-14 % EX AERO
INHALATION_SPRAY | CUTANEOUS | Status: AC
  Filled 2021-10-14: qty 5

## 2021-10-14 MED ORDER — MIDAZOLAM HCL 2 MG/2ML IJ SOLN
INTRAMUSCULAR | Status: AC
  Filled 2021-10-14: qty 2

## 2021-10-14 MED ORDER — FENTANYL CITRATE (PF) 100 MCG/2ML IJ SOLN
INTRAMUSCULAR | Status: AC
  Filled 2021-10-14: qty 2

## 2021-10-14 MED ORDER — FENTANYL CITRATE (PF) 100 MCG/2ML IJ SOLN
INTRAMUSCULAR | Status: AC | PRN
  Administered 2021-10-14: 50 ug via INTRAVENOUS
  Administered 2021-10-14: 25 ug via INTRAVENOUS

## 2021-10-14 NOTE — Progress Notes (Signed)
*  PRELIMINARY RESULTS* Echocardiogram Echocardiogram Transesophageal has been performed.  Melanie Simmons 10/14/2021, 8:23 AM

## 2021-10-14 NOTE — CV Procedure (Signed)
Transesophageal echocardiogram preliminary report  Melanie Simmons [id number] 02-Sep-1941  Preliminary diagnosis Symptomatic valvular heart disease  Postprocedural diagnosis Significant mitral valve prolapse with regurgitation  Time out A timeout was performed by the nursing staff and physicians specifically identifying the procedure performed, identification of the patient, the type of sedation, all allergies and medications, all pertinent medical history, and presedation assessment of nasopharynx. The patient and or family understand the risks of the procedure including the rare risks of death, stroke, heart attack, esophogeal perforation, sore throat, and reaction to medications given.  Moderate sedation During this procedure the patient has received Versed 3 milligrams and fentanyl 75 micrograms to achieve appropriate moderate sedation.  The patient had continued monitoring of heart rate, oxygenation, blood pressure, respiratory rate, and extent of signs of sedation throughout the entire procedure.  The patient received this moderate sedation over a period of 24 minutes.  Both the nursing staff and I were present during the procedure when the patient had moderate sedation for 100% of the time.  Treatment considerations  Further consideration of surgical intervention of significant valvular heart disease  For further details of transesophageal echocardiogram please refer to final report.  Signed,  Corey Skains M.D. [facility] 10/14/2021 8:13 AM

## 2021-10-21 ENCOUNTER — Ambulatory Visit
Admission: RE | Admit: 2021-10-21 | Discharge: 2021-10-21 | Disposition: A | Discharge status: Home / Self Care | Payer: Medicare Other | Source: Ambulatory Visit | Attending: Family Medicine | Admitting: Family Medicine

## 2021-10-21 ENCOUNTER — Other Ambulatory Visit: Payer: Self-pay

## 2021-10-21 DIAGNOSIS — Z79811 Long term (current) use of aromatase inhibitors: Secondary | ICD-10-CM | POA: Diagnosis present

## 2021-10-21 DIAGNOSIS — M85832 Other specified disorders of bone density and structure, left forearm: Secondary | ICD-10-CM

## 2021-11-18 ENCOUNTER — Ambulatory Visit
Admission: RE | Admit: 2021-11-18 | Discharge: 2021-11-18 | Disposition: A | Discharge status: Home / Self Care | Payer: Medicare Other | Attending: Internal Medicine | Admitting: Internal Medicine

## 2021-11-18 ENCOUNTER — Encounter: Payer: Self-pay | Admitting: Internal Medicine

## 2021-11-18 ENCOUNTER — Encounter
Admission: RE | Disposition: A | Discharge status: Home / Self Care | Payer: Self-pay | Source: Home / Self Care | Attending: Internal Medicine

## 2021-11-18 ENCOUNTER — Other Ambulatory Visit: Payer: Self-pay

## 2021-11-18 DIAGNOSIS — I341 Nonrheumatic mitral (valve) prolapse: Secondary | ICD-10-CM | POA: Diagnosis present

## 2021-11-18 DIAGNOSIS — I340 Nonrheumatic mitral (valve) insufficiency: Secondary | ICD-10-CM | POA: Insufficient documentation

## 2021-11-18 DIAGNOSIS — I272 Pulmonary hypertension, unspecified: Secondary | ICD-10-CM | POA: Diagnosis not present

## 2021-11-18 HISTORY — PX: RIGHT/LEFT HEART CATH AND CORONARY ANGIOGRAPHY: CATH118266

## 2021-11-18 SURGERY — RIGHT/LEFT HEART CATH AND CORONARY ANGIOGRAPHY
Anesthesia: Moderate Sedation

## 2021-11-18 MED ORDER — HEPARIN (PORCINE) IN NACL 2000-0.9 UNIT/L-% IV SOLN
INTRAVENOUS | Status: DC | PRN
  Administered 2021-11-18: 1000 mL

## 2021-11-18 MED ORDER — ACETAMINOPHEN 325 MG PO TABS: 650.0000 mg | ORAL_TABLET | ORAL | Status: DC | PRN

## 2021-11-18 MED ORDER — MIDAZOLAM HCL 2 MG/2ML IJ SOLN
INTRAMUSCULAR | Status: AC
  Filled 2021-11-18: qty 2

## 2021-11-18 MED ORDER — LIDOCAINE HCL (PF) 1 % IJ SOLN
INTRAMUSCULAR | Status: DC | PRN
  Administered 2021-11-18: 15 mL via SUBCUTANEOUS

## 2021-11-18 MED ORDER — MIDAZOLAM HCL 2 MG/2ML IJ SOLN
INTRAMUSCULAR | Status: DC | PRN
  Administered 2021-11-18: 1 mg via INTRAVENOUS

## 2021-11-18 MED ORDER — LIDOCAINE HCL 1 % IJ SOLN
INTRAMUSCULAR | Status: AC
  Filled 2021-11-18: qty 20

## 2021-11-18 MED ORDER — ASPIRIN 81 MG PO CHEW
81.0000 mg | CHEWABLE_TABLET | ORAL | Status: AC
  Administered 2021-11-18: 81 mg via ORAL

## 2021-11-18 MED ORDER — FENTANYL CITRATE (PF) 100 MCG/2ML IJ SOLN
INTRAMUSCULAR | Status: AC
  Filled 2021-11-18: qty 2

## 2021-11-18 MED ORDER — ASPIRIN 81 MG PO CHEW
CHEWABLE_TABLET | ORAL | Status: AC
  Filled 2021-11-18: qty 1

## 2021-11-18 MED ORDER — HYDRALAZINE HCL 20 MG/ML IJ SOLN: 10.0000 mg | INTRAMUSCULAR | Status: DC | PRN

## 2021-11-18 MED ORDER — LABETALOL HCL 5 MG/ML IV SOLN: 10.0000 mg | INTRAVENOUS | Status: DC | PRN

## 2021-11-18 MED ORDER — SODIUM CHLORIDE 0.9% FLUSH: 3.0000 mL | Freq: Two times a day (BID) | INTRAVENOUS | Status: DC

## 2021-11-18 MED ORDER — ONDANSETRON HCL 4 MG/2ML IJ SOLN: 4.0000 mg | Freq: Four times a day (QID) | INTRAMUSCULAR | Status: DC | PRN

## 2021-11-18 MED ORDER — SODIUM CHLORIDE 0.9 % IV SOLN: 250.0000 mL | INTRAVENOUS | Status: DC | PRN

## 2021-11-18 MED ORDER — SODIUM CHLORIDE 0.9 % WEIGHT BASED INFUSION
3.0000 mL/kg/h | INTRAVENOUS | Status: AC
  Administered 2021-11-18: 3 mL/kg/h via INTRAVENOUS

## 2021-11-18 MED ORDER — FENTANYL CITRATE (PF) 100 MCG/2ML IJ SOLN
INTRAMUSCULAR | Status: DC | PRN
  Administered 2021-11-18: 25 ug via INTRAVENOUS

## 2021-11-18 MED ORDER — SODIUM CHLORIDE 0.9 % WEIGHT BASED INFUSION
1.0000 mL/kg/h | INTRAVENOUS | Status: DC
  Administered 2021-11-18: 1 mL/kg/h via INTRAVENOUS

## 2021-11-18 MED ORDER — SODIUM CHLORIDE 0.9% FLUSH: 3.0000 mL | INTRAVENOUS | Status: DC | PRN

## 2021-11-18 MED ORDER — HEPARIN (PORCINE) IN NACL 1000-0.9 UT/500ML-% IV SOLN
INTRAVENOUS | Status: AC
  Filled 2021-11-18: qty 1000

## 2021-11-18 MED ORDER — IOHEXOL 350 MG/ML SOLN
INTRAVENOUS | Status: DC | PRN
  Administered 2021-11-18: 60 mL via INTRACARDIAC

## 2021-11-18 MED ORDER — SODIUM CHLORIDE 0.9 % WEIGHT BASED INFUSION: 1.0000 mL/kg/h | INTRAVENOUS | Status: DC

## 2021-11-18 SURGICAL SUPPLY — 13 items
CATH INFINITI 5FR JL4 (CATHETERS) ×2 IMPLANT
CATH INFINITI JR4 5F (CATHETERS) ×2 IMPLANT
CATH SWAN GANZ 7F STRAIGHT (CATHETERS) ×2 IMPLANT
DEVICE CLOSURE MYNXGRIP 5F (Vascular Products) ×2 IMPLANT
DRAPE BRACHIAL (DRAPES) ×2 IMPLANT
NEEDLE PERC 18GX7CM (NEEDLE) ×2 IMPLANT
PACK CARDIAC CATH (CUSTOM PROCEDURE TRAY) ×2 IMPLANT
PROTECTION STATION PRESSURIZED (MISCELLANEOUS) ×2
SET ATX SIMPLICITY (MISCELLANEOUS) ×2 IMPLANT
SHEATH AVANTI 5FR X 11CM (SHEATH) ×2 IMPLANT
SHEATH AVANTI 7FRX11 (SHEATH) ×2 IMPLANT
STATION PROTECTION PRESSURIZED (MISCELLANEOUS) ×1 IMPLANT
WIRE GUIDERIGHT .035X150 (WIRE) ×2 IMPLANT

## 2021-11-18 NOTE — OR Nursing (Signed)
Unsuccessful second iv start right ac, cath lab aware, since right groin to be used for right and left access. Hold off starting second iv.

## 2021-11-30 DIAGNOSIS — Z9889 Other specified postprocedural states: Secondary | ICD-10-CM | POA: Insufficient documentation

## 2021-11-30 DIAGNOSIS — I440 Atrioventricular block, first degree: Secondary | ICD-10-CM | POA: Insufficient documentation

## 2021-11-30 DIAGNOSIS — G8918 Other acute postprocedural pain: Secondary | ICD-10-CM | POA: Insufficient documentation

## 2021-12-30 ENCOUNTER — Other Ambulatory Visit: Payer: Self-pay

## 2021-12-30 ENCOUNTER — Encounter: Payer: Medicare Other | Attending: Internal Medicine | Admitting: *Deleted

## 2021-12-30 DIAGNOSIS — Z954 Presence of other heart-valve replacement: Secondary | ICD-10-CM | POA: Insufficient documentation

## 2021-12-30 DIAGNOSIS — Z48812 Encounter for surgical aftercare following surgery on the circulatory system: Secondary | ICD-10-CM | POA: Insufficient documentation

## 2021-12-30 DIAGNOSIS — Z9889 Other specified postprocedural states: Secondary | ICD-10-CM

## 2021-12-30 NOTE — Progress Notes (Signed)
Initial telephone orientation completed. Diagnosis can be found in [facility] 11/30. EP orientation scheduled for Wednesday 1/25 at 2pm.

## 2022-01-13 ENCOUNTER — Other Ambulatory Visit: Payer: Self-pay

## 2022-01-13 VITALS — Ht 68.8 in | Wt 184.9 lb

## 2022-01-13 DIAGNOSIS — Z48812 Encounter for surgical aftercare following surgery on the circulatory system: Secondary | ICD-10-CM | POA: Diagnosis present

## 2022-01-13 DIAGNOSIS — Z954 Presence of other heart-valve replacement: Secondary | ICD-10-CM | POA: Diagnosis not present

## 2022-01-13 DIAGNOSIS — Z9889 Other specified postprocedural states: Secondary | ICD-10-CM

## 2022-01-13 NOTE — Progress Notes (Signed)
Cardiac Individual Treatment Plan  Patient Details  Name: Melanie Simmons MRN: [id number] Date of Birth: 30-Aug-1941 Referring Provider:   Flowsheet Row Cardiac Rehab from 01/13/2022 in [facility] Cardiac and Pulmonary Rehab  Referring Provider Serafina Royals MD       Initial Encounter Date:  Flowsheet Row Cardiac Rehab from 01/13/2022 in [facility] Cardiac and Pulmonary Rehab  Date 01/13/22       Visit Diagnosis: S/P mitral valve repair  Patient's Home Medications on Admission:  Current Outpatient Medications:    aspirin 81 MG chewable tablet, Chew by mouth., Disp: , Rfl:    Biotin w/ Vitamins C & E (HAIR/SKIN/NAILS PO), Take 1 tablet by mouth in the morning., Disp: , Rfl:    Calcium Carb-Cholecalciferol (CALCIUM 500+D3 PO), Take 1 tablet by mouth in the morning., Disp: , Rfl:    Cholecalciferol (VITAMIN D3 PO), Take 1 tablet by mouth in the morning., Disp: , Rfl:    Multiple Minerals-Vitamins (CAL-MAG-ZINC-D PO), Take 1 tablet by mouth in the morning. (Patient not taking: Reported on 11/18/2021), Disp: , Rfl:    triamterene-hydrochlorothiazide (DYAZIDE) 37.5-25 MG capsule, Take 1 capsule by mouth in the morning., Disp: , Rfl:   Past Medical History: Past Medical History:  Diagnosis Date   Breast cancer ([place])    Hyperlipidemia    Hypertension    Motion sickness    ships   PONV (postoperative nausea and vomiting)    Vertigo     Tobacco Use: Social History   Tobacco Use  Smoking Status Former  Smokeless Tobacco Never    Labs: Recent Review Flowsheet Data   There is no flowsheet data to display.      Exercise Target Goals: Exercise Program Goal: Individual exercise prescription set using results from initial 6 min walk test and THRR while considering  patients activity barriers and safety.   Exercise Prescription Goal: Initial exercise prescription builds to 30-45 minutes a day of aerobic activity, 2-3 days per week.  Home exercise guidelines will be given to patient  during program as part of exercise prescription that the participant will acknowledge.   Education: Aerobic Exercise: - Group verbal and visual presentation on the components of exercise prescription. Introduces F.I.T.T principle from ACSM for exercise prescriptions.  Reviews F.I.T.T. principles of aerobic exercise including progression. Written material given at graduation.   Education: Resistance Exercise: - Group verbal and visual presentation on the components of exercise prescription. Introduces F.I.T.T principle from ACSM for exercise prescriptions  Reviews F.I.T.T. principles of resistance exercise including progression. Written material given at graduation.    Education: Exercise & Equipment Safety: - Individual verbal instruction and demonstration of equipment use and safety with use of the equipment. Flowsheet Row Cardiac Rehab from 01/13/2022 in [facility] Cardiac and Pulmonary Rehab  Education need identified 01/13/22  Date 01/13/22  Educator [place]  Instruction Review Code 1- Verbalizes Understanding       Education: Exercise Physiology & General Exercise Guidelines: - Group verbal and written instruction with models to review the exercise physiology of the cardiovascular system and associated critical values. Provides general exercise guidelines with specific guidelines to those with heart or lung disease.  Flowsheet Row Cardiac Rehab from 01/13/2022 in [facility] Cardiac and Pulmonary Rehab  Education need identified 01/13/22       Education: Flexibility, Balance, Mind/Body Relaxation: - Group verbal and visual presentation with interactive activity on the components of exercise prescription. Introduces F.I.T.T principle from ACSM for exercise prescriptions. Reviews F.I.T.T. principles of flexibility and balance exercise  training including progression. Also discusses the mind body connection.  Reviews various relaxation techniques to help reduce and manage stress (i.e. Deep breathing,  progressive muscle relaxation, and visualization). Balance handout provided to take home. Written material given at graduation.   Activity Barriers & Risk Stratification:  Activity Barriers & Cardiac Risk Stratification - 01/13/22 1546       Activity Barriers & Cardiac Risk Stratification   Activity Barriers Shortness of Breath;Deconditioning    Cardiac Risk Stratification Low             6 Minute Walk:  6 Minute Walk     Row Name 01/13/22 1622         6 Minute Walk   Phase Initial     Distance 985 feet     Walk Time 6 minutes     # of Rest Breaks 0     MPH 1.86     METS 1.93     RPE 11     Perceived Dyspnea  1     VO2 Peak 6.78     Symptoms No     Resting HR 90 bpm     Resting BP 112/64     Resting Oxygen Saturation  97 %     Exercise Oxygen Saturation  during 6 min walk 96 %     Max Ex. HR 104 bpm     Max Ex. BP 132/64     2 Minute Post BP 122/66              Oxygen Initial Assessment:   Oxygen Re-Evaluation:   Oxygen Discharge (Final Oxygen Re-Evaluation):   Initial Exercise Prescription:  Initial Exercise Prescription - 01/13/22 1600       Date of Initial Exercise RX and Referring Provider   Date 01/13/22    Referring Provider Serafina Royals MD      Oxygen   Maintain Oxygen Saturation 88% or higher      Treadmill   MPH 1.8    Grade 0.5    Minutes 15    METs 2.5      Recumbant Bike   Level 1    RPM 60    Minutes 15    METs 1.9      NuStep   Level 1    SPM 80    Minutes 15    METs 1.9      Prescription Details   Frequency (times per week) 3    Duration Progress to 30 minutes of continuous aerobic without signs/symptoms of physical distress      Intensity   THRR 40-80% of Max Heartrate 110 - 130    Ratings of Perceived Exertion 11-13    Perceived Dyspnea 0-4      Progression   Progression Continue to progress workloads to maintain intensity without signs/symptoms of physical distress.      Resistance Training    Training Prescription Yes    Weight 3 lb    Reps 10-15             Perform Capillary Blood Glucose checks as needed.  Exercise Prescription Changes:   Exercise Prescription Changes     Row Name 01/13/22 1600             Response to Exercise   Blood Pressure (Admit) 112/64       Blood Pressure (Exercise) 132/64       Blood Pressure (Exit) 122/66       Heart Rate (Admit) 90  bpm       Heart Rate (Exercise) 104 bpm       Heart Rate (Exit) 94 bpm       Oxygen Saturation (Admit) 97 %       Oxygen Saturation (Exercise) 96 %       Rating of Perceived Exertion (Exercise) 11       Perceived Dyspnea (Exercise) 1       Symptoms None       Comments walk test results                Exercise Comments:   Exercise Goals and Review:   Exercise Goals     Row Name 01/13/22 1636             Exercise Goals   Increase Physical Activity Yes       Intervention Provide advice, education, support and counseling about physical activity/exercise needs.;Develop an individualized exercise prescription for aerobic and resistive training based on initial evaluation findings, risk stratification, comorbidities and participant's personal goals.       Expected Outcomes Short Term: Attend rehab on a regular basis to increase amount of physical activity.;Long Term: Add in home exercise to make exercise part of routine and to increase amount of physical activity.;Long Term: Exercising regularly at least 3-5 days a week.       Increase Strength and Stamina Yes       Intervention Provide advice, education, support and counseling about physical activity/exercise needs.;Develop an individualized exercise prescription for aerobic and resistive training based on initial evaluation findings, risk stratification, comorbidities and participant's personal goals.       Expected Outcomes Short Term: Increase workloads from initial exercise prescription for resistance, speed, and METs.;Short Term: Perform  resistance training exercises routinely during rehab and add in resistance training at home;Long Term: Improve cardiorespiratory fitness, muscular endurance and strength as measured by increased METs and functional capacity (6MWT)       Able to understand and use rate of perceived exertion (RPE) scale Yes       Intervention Provide education and explanation on how to use RPE scale       Expected Outcomes Short Term: Able to use RPE daily in rehab to express subjective intensity level;Long Term:  Able to use RPE to guide intensity level when exercising independently       Able to understand and use Dyspnea scale Yes       Intervention Provide education and explanation on how to use Dyspnea scale       Expected Outcomes Short Term: Able to use Dyspnea scale daily in rehab to express subjective sense of shortness of breath during exertion;Long Term: Able to use Dyspnea scale to guide intensity level when exercising independently       Knowledge and understanding of Target Heart Rate Range (THRR) Yes       Intervention Provide education and explanation of THRR including how the numbers were predicted and where they are located for reference       Expected Outcomes Short Term: Able to state/look up THRR;Long Term: Able to use THRR to govern intensity when exercising independently;Short Term: Able to use daily as guideline for intensity in rehab       Able to check pulse independently Yes       Intervention Provide education and demonstration on how to check pulse in carotid and radial arteries.;Review the importance of being able to check your own pulse for safety during independent exercise  Expected Outcomes Short Term: Able to explain why pulse checking is important during independent exercise;Long Term: Able to check pulse independently and accurately       Understanding of Exercise Prescription Yes       Intervention Provide education, explanation, and written materials on patient's individual  exercise prescription       Expected Outcomes Short Term: Able to explain program exercise prescription;Long Term: Able to explain home exercise prescription to exercise independently                Exercise Goals Re-Evaluation :   Discharge Exercise Prescription (Final Exercise Prescription Changes):  Exercise Prescription Changes - 01/13/22 1600       Response to Exercise   Blood Pressure (Admit) 112/64    Blood Pressure (Exercise) 132/64    Blood Pressure (Exit) 122/66    Heart Rate (Admit) 90 bpm    Heart Rate (Exercise) 104 bpm    Heart Rate (Exit) 94 bpm    Oxygen Saturation (Admit) 97 %    Oxygen Saturation (Exercise) 96 %    Rating of Perceived Exertion (Exercise) 11    Perceived Dyspnea (Exercise) 1    Symptoms None    Comments walk test results             Nutrition:  Target Goals: Understanding of nutrition guidelines, daily intake of sodium 1500mg , cholesterol 200mg , calories 30% from fat and 7% or less from saturated fats, daily to have 5 or more servings of fruits and vegetables.  Education: All About Nutrition: -Group instruction provided by verbal, written material, interactive activities, discussions, models, and posters to present general guidelines for heart healthy nutrition including fat, fiber, MyPlate, the role of sodium in heart healthy nutrition, utilization of the nutrition label, and utilization of this knowledge for meal planning. Follow up email sent as well. Written material given at graduation. Flowsheet Row Cardiac Rehab from 01/13/2022 in [facility] Cardiac and Pulmonary Rehab  Education need identified 01/13/22       Biometrics:  Pre Biometrics - 01/13/22 1546       Pre Biometrics   Height 5' 8.8" (1.748 m)    Weight 184 lb 14.4 oz (83.9 kg)    BMI (Calculated) 27.45    Single Leg Stand 2 seconds              Nutrition Therapy Plan and Nutrition Goals:  Nutrition Therapy & Goals - 01/13/22 1620       Intervention Plan    Intervention Prescribe, educate and counsel regarding individualized specific dietary modifications aiming towards targeted core components such as weight, hypertension, lipid management, diabetes, heart failure and other comorbidities.    Expected Outcomes Short Term Goal: Understand basic principles of dietary content, such as calories, fat, sodium, cholesterol and nutrients.;Short Term Goal: A plan has been developed with personal nutrition goals set during dietitian appointment.;Long Term Goal: Adherence to prescribed nutrition plan.             Nutrition Assessments:  MEDIFICTS Score Key: ?70 Need to make dietary changes  40-70 Heart Healthy Diet ? 40 Therapeutic Level Cholesterol Diet  Flowsheet Row Cardiac Rehab from 01/13/2022 in [facility] Cardiac and Pulmonary Rehab  Picture Your Plate Total Score on Admission 71      Picture Your Plate Scores: <56 Unhealthy dietary pattern with much room for improvement. 41-50 Dietary pattern unlikely to meet recommendations for good health and room for improvement. 51-60 More healthful dietary pattern, with some room for improvement.  >  60 Healthy dietary pattern, although there may be some specific behaviors that could be improved.    Nutrition Goals Re-Evaluation:   Nutrition Goals Discharge (Final Nutrition Goals Re-Evaluation):   Psychosocial: Target Goals: Acknowledge presence or absence of significant depression and/or stress, maximize coping skills, provide positive support system. Participant is able to verbalize types and ability to use techniques and skills needed for reducing stress and depression.   Education: Stress, Anxiety, and Depression - Group verbal and visual presentation to define topics covered.  Reviews how body is impacted by stress, anxiety, and depression.  Also discusses healthy ways to reduce stress and to treat/manage anxiety and depression.  Written material given at graduation.   Education: Sleep  Hygiene -Provides group verbal and written instruction about how sleep can affect your health.  Define sleep hygiene, discuss sleep cycles and impact of sleep habits. Review good sleep hygiene tips.    Initial Review & Psychosocial Screening:  Initial Psych Review & Screening - 12/30/21 1536       Initial Review   Current issues with None Identified      Family Dynamics   Good Support System? Yes   neighbors     Barriers   Psychosocial barriers to participate in program There are no identifiable barriers or psychosocial needs.;The patient should benefit from training in stress management and relaxation.      Screening Interventions   Interventions Encouraged to exercise;Provide feedback about the scores to participant;To provide support and resources with identified psychosocial needs    Expected Outcomes Short Term goal: Utilizing psychosocial counselor, staff and physician to assist with identification of specific Stressors or current issues interfering with healing process. Setting desired goal for each stressor or current issue identified.;Short Term goal: Identification and review with participant of any Quality of Life or Depression concerns found by scoring the questionnaire.;Long Term Goal: Stressors or current issues are controlled or eliminated.;Long Term goal: The participant improves quality of Life and PHQ9 Scores as seen by post scores and/or verbalization of changes             Quality of Life Scores:   Quality of Life - 01/13/22 1553       Quality of Life   Select Quality of Life      Quality of Life Scores   Health/Function Pre 23.57 %    Socioeconomic Pre 30 %    Psych/Spiritual Pre 30 %    Family Pre 30 %    GLOBAL Pre 27.1 %            Scores of 19 and below usually indicate a poorer quality of life in these areas.  A difference of  2-3 points is a clinically meaningful difference.  A difference of 2-3 points in the total score of the Quality of Life  Index has been associated with significant improvement in overall quality of life, self-image, physical symptoms, and general health in studies assessing change in quality of life.  PHQ-9: Recent Review Flowsheet Data     Depression screen [facility] 2/9 01/13/2022 05/27/2021   Decreased Interest 0 0   Down, Depressed, Hopeless 0 0   PHQ - 2 Score 0 0   Altered sleeping 0 2   Tired, decreased energy 1 0   Change in appetite 2 0   Feeling bad or failure about yourself  0 0   Trouble concentrating 0 1   Moving slowly or fidgety/restless 0 0   Suicidal thoughts 0 0  PHQ-9 Score 3 3   Difficult doing work/chores Not difficult at all Not difficult at all      Interpretation of Total Score  Total Score Depression Severity:  1-4 = Minimal depression, 5-9 = Mild depression, 10-14 = Moderate depression, 15-19 = Moderately severe depression, 20-27 = Severe depression   Psychosocial Evaluation and Intervention:  Psychosocial Evaluation - 12/30/21 1544       Psychosocial Evaluation & Interventions   Interventions Encouraged to exercise with the program and follow exercise prescription    Comments Kinberly 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 She has felt a little more tired than usual and is ready to get back to her active lifestyle. 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 She reports having a great support system and neighbors that have been helping with meals and such. Her appetite hasn't been the best since her surgery but she is trying to eat healthy snacks.    Expected Outcomes Short: attend cardiac rehab for education and exercise. Long: develop and maintain positive self care habits.    Continue Psychosocial Services  Follow up required by staff             Psychosocial Re-Evaluation:   Psychosocial Discharge (Final Psychosocial Re-Evaluation):   Vocational Rehabilitation: Provide vocational rehab assistance to qualifying candidates.    Vocational Rehab Evaluation & Intervention:  Vocational Rehab - 12/30/21 1544       Initial Vocational Rehab Evaluation & Intervention   Assessment shows need for Vocational Rehabilitation No             Education: Education Goals: Education classes will be provided on a variety of topics geared toward better understanding of heart health and risk factor modification. Participant will state understanding/return demonstration of topics presented as noted by education test scores.  Learning Barriers/Preferences:  Learning Barriers/Preferences - 12/30/21 1537       Learning Barriers/Preferences   Learning Barriers None    Learning Preferences None             General Cardiac Education Topics:  AED/CPR: - Group verbal and written instruction with the use of models to demonstrate the basic use of the AED with the basic ABC's of resuscitation.   Anatomy and Cardiac Procedures: - Group verbal and visual presentation and models provide information about basic cardiac anatomy and function. Reviews the testing methods done to diagnose heart disease and the outcomes of the test results. Describes the treatment choices: Medical Management, Angioplasty, or Coronary Bypass Surgery for treating various heart conditions including Myocardial Infarction, Angina, Valve Disease, and Cardiac Arrhythmias.  Written material given at graduation. Flowsheet Row Cardiac Rehab from 01/13/2022 in [facility] Cardiac and Pulmonary Rehab  Education need identified 01/13/22       Medication Safety: - Group verbal and visual instruction to review commonly prescribed medications for heart and lung disease. Reviews the medication, class of the drug, and side effects. Includes the steps to properly store meds and maintain the prescription regimen.  Written material given at graduation.   Intimacy: - Group verbal instruction through game format to discuss how heart and lung disease can affect sexual intimacy.  Written material given at graduation..   Know Your Numbers and Heart Failure: - Group verbal and visual instruction to discuss disease risk factors for cardiac and pulmonary disease and treatment options.  Reviews associated critical values for Overweight/Obesity, Hypertension, Cholesterol, and Diabetes.  Discusses basics of heart failure: signs/symptoms and  treatments.  Introduces Heart Failure Zone chart for action plan for heart failure.  Written material given at graduation.   Infection Prevention: - Provides verbal and written material to individual with discussion of infection control including proper hand washing and proper equipment cleaning during exercise session. Flowsheet Row Cardiac Rehab from 01/13/2022 in [facility] Cardiac and Pulmonary Rehab  Education need identified 01/13/22  Date 01/13/22  Educator [place]  Instruction Review Code 1- Verbalizes Understanding       Falls Prevention: - Provides verbal and written material to individual with discussion of falls prevention and safety. Flowsheet Row Cardiac Rehab from 01/13/2022 in [facility] Cardiac and Pulmonary Rehab  Education need identified 01/13/22  Date 01/13/22  Educator [place]  Instruction Review Code 1- Verbalizes Understanding       Other: -Provides group and verbal instruction on various topics (see comments)   Knowledge Questionnaire Score:  Knowledge Questionnaire Score - 01/13/22 1553       Knowledge Questionnaire Score   Pre Score 23/26: Exercise, Angina, Nutrition             Core Components/Risk Factors/Patient Goals at Admission:  Personal Goals and Risk Factors at Admission - 01/13/22 1637       Core Components/Risk Factors/Patient Goals on Admission    Weight Management Yes;Weight Loss    Intervention Weight Management: Develop a combined nutrition and exercise program designed to reach desired caloric intake, while maintaining appropriate intake of nutrient and fiber, sodium and fats, and appropriate  energy expenditure required for the weight goal.;Weight Management: Provide education and appropriate resources to help participant work on and attain dietary goals.;Weight Management/Obesity: Establish reasonable short term and long term weight goals.    Admit Weight 184 lb (83.5 kg)    Goal Weight: Short Term 180 lb (81.6 kg)    Goal Weight: Long Term 174 lb (78.9 kg)    Expected Outcomes Short Term: Continue to assess and modify interventions until short term weight is achieved;Long Term: Adherence to nutrition and physical activity/exercise program aimed toward attainment of established weight goal;Weight Loss: Understanding of general recommendations for a balanced deficit meal plan, which promotes 1-2 lb weight loss per week and includes a negative energy balance of [phone] kcal/d;Understanding recommendations for meals to include 15-35% energy as protein, 25-35% energy from fat, 35-60% energy from carbohydrates, less than 200mg  of dietary cholesterol, 20-35 gm of total fiber daily;Understanding of distribution of calorie intake throughout the day with the consumption of 4-5 meals/snacks    Hypertension Yes    Intervention Provide education on lifestyle modifcations including regular physical activity/exercise, weight management, moderate sodium restriction and increased consumption of fresh fruit, vegetables, and low fat dairy, alcohol moderation, and smoking cessation.;Monitor prescription use compliance.    Expected Outcomes Short Term: Continued assessment and intervention until BP is < 140/49mm HG in hypertensive participants. < 130/27mm HG in hypertensive participants with diabetes, heart failure or chronic kidney disease.;Long Term: Maintenance of blood pressure at goal levels.    Lipids Yes    Intervention Provide education and support for participant on nutrition & aerobic/resistive exercise along with prescribed medications to achieve LDL 70mg , HDL >40mg .    Expected Outcomes Short Term:  Participant states understanding of desired cholesterol values and is compliant with medications prescribed. Participant is following exercise prescription and nutrition guidelines.;Long Term: Cholesterol controlled with medications as prescribed, with individualized exercise RX and with personalized nutrition plan. Value goals: LDL < 70mg , HDL > 40 mg.  Education:Diabetes - Individual verbal and written instruction to review signs/symptoms of diabetes, desired ranges of glucose level fasting, after meals and with exercise. Acknowledge that pre and post exercise glucose checks will be done for 3 sessions at entry of program.   Core Components/Risk Factors/Patient Goals Review:    Core Components/Risk Factors/Patient Goals at Discharge (Final Review):    ITP Comments:  ITP Comments     Row Name 12/30/21 1541 01/13/22 1538         ITP Comments Initial telephone orientation completed. Diagnosis can be found in [facility] 11/30. EP orientation scheduled for Wednesday 1/25 at 2pm. Completed 6MWT and gym orientation. Initial ITP created and sent for review to Dr. Emily Filbert, Medical Director.               Comments: Initial ITP

## 2022-01-13 NOTE — Patient Instructions (Signed)
Patient Instructions  Patient Details  Name: Melanie Simmons MRN: [id number] Date of Birth: [birth year] Referring Provider:  Corey Skains, MD  Below are your personal goals for exercise, nutrition, and risk factors. Our goal is to help you stay on track towards obtaining and maintaining these goals. We will be discussing your progress on these goals with you throughout the program.  Initial Exercise Prescription:  Initial Exercise Prescription - 01/13/22 1600       Date of Initial Exercise RX and Referring Provider   Date 01/13/22    Referring Provider Serafina Royals MD      Oxygen   Maintain Oxygen Saturation 88% or higher      Treadmill   MPH 1.8    Grade 0.5    Minutes 15    METs 2.5      Recumbant Bike   Level 1    RPM 60    Minutes 15    METs 1.9      NuStep   Level 1    SPM 80    Minutes 15    METs 1.9      Prescription Details   Frequency (times per week) 3    Duration Progress to 30 minutes of continuous aerobic without signs/symptoms of physical distress      Intensity   THRR 40-80% of Max Heartrate 110 - 130    Ratings of Perceived Exertion 11-13    Perceived Dyspnea 0-4      Progression   Progression Continue to progress workloads to maintain intensity without signs/symptoms of physical distress.      Resistance Training   Training Prescription Yes    Weight 3 lb    Reps 10-15             Exercise Goals: Frequency: Be able to perform aerobic exercise two to three times per week in program working toward 2-5 days per week of home exercise.  Intensity: Work with a perceived exertion of 11 (fairly light) - 15 (hard) while following your exercise prescription.  We will make changes to your prescription with you as you progress through the program.   Duration: Be able to do 30 to 45 minutes of continuous aerobic exercise in addition to a 5 minute warm-up and a 5 minute cool-down routine.   Nutrition Goals: Your personal nutrition goals  will be established when you do your nutrition analysis with the dietician.  The following are general nutrition guidelines to follow: Cholesterol < 200mg /day Sodium < 1500mg /day Fiber: Women over 50 yrs - 21 grams per day  Personal Goals:  Personal Goals and Risk Factors at Admission - 01/13/22 1637       Core Components/Risk Factors/Patient Goals on Admission    Weight Management Yes;Weight Loss    Intervention Weight Management: Develop a combined nutrition and exercise program designed to reach desired caloric intake, while maintaining appropriate intake of nutrient and fiber, sodium and fats, and appropriate energy expenditure required for the weight goal.;Weight Management: Provide education and appropriate resources to help participant work on and attain dietary goals.;Weight Management/Obesity: Establish reasonable short term and long term weight goals.    Admit Weight 184 lb (83.5 kg)    Goal Weight: Short Term 180 lb (81.6 kg)    Goal Weight: Long Term 174 lb (78.9 kg)    Expected Outcomes Short Term: Continue to assess and modify interventions until short term weight is achieved;Long Term: Adherence to nutrition and physical activity/exercise program aimed toward attainment  of established weight goal;Weight Loss: Understanding of general recommendations for a balanced deficit meal plan, which promotes 1-2 lb weight loss per week and includes a negative energy balance of [phone] kcal/d;Understanding recommendations for meals to include 15-35% energy as protein, 25-35% energy from fat, 35-60% energy from carbohydrates, less than 200mg  of dietary cholesterol, 20-35 gm of total fiber daily;Understanding of distribution of calorie intake throughout the day with the consumption of 4-5 meals/snacks    Hypertension Yes    Intervention Provide education on lifestyle modifcations including regular physical activity/exercise, weight management, moderate sodium restriction and increased  consumption of fresh fruit, vegetables, and low fat dairy, alcohol moderation, and smoking cessation.;Monitor prescription use compliance.    Expected Outcomes Short Term: Continued assessment and intervention until BP is < 140/42mm HG in hypertensive participants. < 130/59mm HG in hypertensive participants with diabetes, heart failure or chronic kidney disease.;Long Term: Maintenance of blood pressure at goal levels.    Lipids Yes    Intervention Provide education and support for participant on nutrition & aerobic/resistive exercise along with prescribed medications to achieve LDL 70mg , HDL >40mg .    Expected Outcomes Short Term: Participant states understanding of desired cholesterol values and is compliant with medications prescribed. Participant is following exercise prescription and nutrition guidelines.;Long Term: Cholesterol controlled with medications as prescribed, with individualized exercise RX and with personalized nutrition plan. Value goals: LDL < 70mg , HDL > 40 mg.             Tobacco Use Initial Evaluation: Social History   Tobacco Use  Smoking Status Former  Smokeless Tobacco Never    Exercise Goals and Review:  Exercise Goals     Row Name 01/13/22 1636             Exercise Goals   Increase Physical Activity Yes       Intervention Provide advice, education, support and counseling about physical activity/exercise needs.;Develop an individualized exercise prescription for aerobic and resistive training based on initial evaluation findings, risk stratification, comorbidities and participant's personal goals.       Expected Outcomes Short Term: Attend rehab on a regular basis to increase amount of physical activity.;Long Term: Add in home exercise to make exercise part of routine and to increase amount of physical activity.;Long Term: Exercising regularly at least 3-5 days a week.       Increase Strength and Stamina Yes       Intervention Provide advice, education,  support and counseling about physical activity/exercise needs.;Develop an individualized exercise prescription for aerobic and resistive training based on initial evaluation findings, risk stratification, comorbidities and participant's personal goals.       Expected Outcomes Short Term: Increase workloads from initial exercise prescription for resistance, speed, and METs.;Short Term: Perform resistance training exercises routinely during rehab and add in resistance training at home;Long Term: Improve cardiorespiratory fitness, muscular endurance and strength as measured by increased METs and functional capacity (6MWT)       Able to understand and use rate of perceived exertion (RPE) scale Yes       Intervention Provide education and explanation on how to use RPE scale       Expected Outcomes Short Term: Able to use RPE daily in rehab to express subjective intensity level;Long Term:  Able to use RPE to guide intensity level when exercising independently       Able to understand and use Dyspnea scale Yes       Intervention Provide education and explanation on how to use  Dyspnea scale       Expected Outcomes Short Term: Able to use Dyspnea scale daily in rehab to express subjective sense of shortness of breath during exertion;Long Term: Able to use Dyspnea scale to guide intensity level when exercising independently       Knowledge and understanding of Target Heart Rate Range (THRR) Yes       Intervention Provide education and explanation of THRR including how the numbers were predicted and where they are located for reference       Expected Outcomes Short Term: Able to state/look up THRR;Long Term: Able to use THRR to govern intensity when exercising independently;Short Term: Able to use daily as guideline for intensity in rehab       Able to check pulse independently Yes       Intervention Provide education and demonstration on how to check pulse in carotid and radial arteries.;Review the importance of  being able to check your own pulse for safety during independent exercise       Expected Outcomes Short Term: Able to explain why pulse checking is important during independent exercise;Long Term: Able to check pulse independently and accurately       Understanding of Exercise Prescription Yes       Intervention Provide education, explanation, and written materials on patient's individual exercise prescription       Expected Outcomes Short Term: Able to explain program exercise prescription;Long Term: Able to explain home exercise prescription to exercise independently                Copy of goals given to participant.

## 2022-01-19 ENCOUNTER — Other Ambulatory Visit: Payer: Self-pay

## 2022-01-19 DIAGNOSIS — Z9889 Other specified postprocedural states: Secondary | ICD-10-CM

## 2022-01-19 DIAGNOSIS — Z48812 Encounter for surgical aftercare following surgery on the circulatory system: Secondary | ICD-10-CM | POA: Diagnosis not present

## 2022-01-19 NOTE — Progress Notes (Signed)
Daily Session Note  Patient Details  Name: Melanie Simmons MRN: [id number] Date of Birth: 03-May-1941 Referring Provider:   Flowsheet Row Cardiac Rehab from 01/13/2022 in [facility] Cardiac and Pulmonary Rehab  Referring Provider Serafina Royals MD       Encounter Date: 01/19/2022  Check In:  Session Check In - 01/19/22 1102       Check-In   Supervising physician immediately available to respond to emergencies See telemetry face sheet for immediately available ER MD    Location [facility]-Cardiac & Pulmonary Rehab    Staff Present Birdie Sons, MPA, RN;Melissa Lonsdale, RDN, Rowe Pavy, BA, ACSM CEP, Exercise Physiologist    Virtual Visit No    Medication changes reported     No    Fall or balance concerns reported    No    Warm-up and Cool-down Performed on first and last piece of equipment    Resistance Training Performed Yes    VAD Patient? No    PAD/SET Patient? No      Pain Assessment   Currently in Pain? No/denies                Social History   Tobacco Use  Smoking Status Former  Smokeless Tobacco Never    Goals Met:  Independence with exercise equipment Exercise tolerated well No report of concerns or symptoms today Strength training completed today  Goals Unmet:  Not Applicable  Comments: First full day of exercise!  Patient was oriented to gym and equipment including functions, settings, policies, and procedures.  Patient's individual exercise prescription and treatment plan were reviewed.  All starting workloads were established based on the results of the 6 minute walk test done at initial orientation visit.  The plan for exercise progression was also introduced and progression will be customized based on patient's performance and goals.    Dr. Emily Filbert is Medical Director for [place].  Dr. Ottie Glazier is Medical Director for [facility] Pulmonary Rehabilitation.

## 2022-01-21 ENCOUNTER — Encounter: Payer: Medicare Other | Attending: Internal Medicine

## 2022-01-21 ENCOUNTER — Other Ambulatory Visit: Payer: Self-pay

## 2022-01-21 DIAGNOSIS — Z9889 Other specified postprocedural states: Secondary | ICD-10-CM | POA: Diagnosis not present

## 2022-01-21 NOTE — Progress Notes (Signed)
Daily Session Note  Patient Details  Name: Melanie Simmons MRN: [id number] Date of Birth: 1941/09/25 Referring Provider:   Flowsheet Row Cardiac Rehab from 01/13/2022 in [facility] Cardiac and Pulmonary Rehab  Referring Provider Serafina Royals MD       Encounter Date: 01/21/2022  Check In:  Session Check In - 01/21/22 1036       Check-In   Supervising physician immediately available to respond to emergencies See telemetry face sheet for immediately available ER MD    Location [facility]-Cardiac & Pulmonary Rehab    Staff Present Birdie Sons, MPA, Nino Glow, MS, ASCM CEP, Exercise Physiologist;Melissa [place], RDN, Rowe Pavy, BA, ACSM CEP, Exercise Physiologist    Virtual Visit No    Medication changes reported     No    Fall or balance concerns reported    No    Warm-up and Cool-down Performed on first and last piece of equipment    Resistance Training Performed Yes    VAD Patient? No    PAD/SET Patient? No      Pain Assessment   Currently in Pain? No/denies                Social History   Tobacco Use  Smoking Status Former  Smokeless Tobacco Never    Goals Met:  Independence with exercise equipment Exercise tolerated well No report of concerns or symptoms today Strength training completed today  Goals Unmet:  Not Applicable  Comments: Pt able to follow exercise prescription today without complaint.  Will continue to monitor for progression.    Dr. Emily Filbert is Medical Director for [place].  Dr. Ottie Glazier is Medical Director for [facility] Pulmonary Rehabilitation.

## 2022-01-22 ENCOUNTER — Other Ambulatory Visit: Payer: Self-pay

## 2022-01-22 ENCOUNTER — Encounter: Payer: Medicare Other | Admitting: *Deleted

## 2022-01-22 DIAGNOSIS — Z9889 Other specified postprocedural states: Secondary | ICD-10-CM | POA: Diagnosis not present

## 2022-01-22 NOTE — Progress Notes (Signed)
Daily Session Note  Patient Details  Name: Melanie Simmons MRN: [id number] Date of Birth: [birth year] Referring Provider:   Flowsheet Row Cardiac Rehab from 01/13/2022 in [facility] Cardiac and Pulmonary Rehab  Referring Provider Serafina Royals MD       Encounter Date: 01/22/2022  Check In:  Session Check In - 01/22/22 0914       Check-In   Supervising physician immediately available to respond to emergencies See telemetry face sheet for immediately available ER MD    Location [facility]-Cardiac & Pulmonary Rehab    Staff Present Renita Papa, RN BSN;Joseph [place], RCP,RRT,BSRT;Jessica [place], [place], RCEP, CCRP, CCET    Virtual Visit No    Medication changes reported     No    Fall or balance concerns reported    No    Warm-up and Cool-down Performed on first and last piece of equipment    Resistance Training Performed Yes    VAD Patient? No    PAD/SET Patient? No      Pain Assessment   Currently in Pain? No/denies                Social History   Tobacco Use  Smoking Status Former  Smokeless Tobacco Never    Goals Met:  Independence with exercise equipment Exercise tolerated well No report of concerns or symptoms today Strength training completed today  Goals Unmet:  Not Applicable  Comments: Pt able to follow exercise prescription today without complaint.  Will continue to monitor for progression.    Dr. Emily Filbert is Medical Director for [place].  Dr. Ottie Glazier is Medical Director for [facility] Pulmonary Rehabilitation.

## 2022-01-26 ENCOUNTER — Other Ambulatory Visit: Payer: Self-pay

## 2022-01-26 DIAGNOSIS — Z9889 Other specified postprocedural states: Secondary | ICD-10-CM

## 2022-01-26 NOTE — Progress Notes (Signed)
Daily Session Note  Patient Details  Name: Melanie Simmons MRN: [id number] Date of Birth: 03/22/1941 Referring Provider:   Flowsheet Row Cardiac Rehab from 01/13/2022 in [facility] Cardiac and Pulmonary Rehab  Referring Provider Serafina Royals MD       Encounter Date: 01/26/2022  Check In:  Session Check In - 01/26/22 1112       Check-In   Supervising physician immediately available to respond to emergencies See telemetry face sheet for immediately available ER MD    Location [facility]-Cardiac & Pulmonary Rehab    Staff Present Birdie Sons, MPA, RN;Melissa [place], RDN, LDN;Jessica [place], MA, RCEP, CCRP, CCET;Amanda Sommer, BA, ACSM CEP, Exercise Physiologist    Virtual Visit No    Medication changes reported     No    Fall or balance concerns reported    No    Warm-up and Cool-down Performed on first and last piece of equipment    Resistance Training Performed Yes    VAD Patient? No    PAD/SET Patient? No      Pain Assessment   Currently in Pain? No/denies                Social History   Tobacco Use  Smoking Status Former  Smokeless Tobacco Never    Goals Met:  Independence with exercise equipment Exercise tolerated well No report of concerns or symptoms today Strength training completed today  Goals Unmet:  Not Applicable  Comments: Pt able to follow exercise prescription today without complaint.  Will continue to monitor for progression.  Reviewed home exercise with pt today.  Pt plans to walking and a stationary bike at home for exercise.  We also talked about using our staff videos as she is not a fan of her bike.  Reviewed THR, pulse, RPE, sign and symptoms, pulse oximetery and when to call 911 or MD.  Also discussed weather considerations and indoor options.  Pt voiced understanding.   Dr. Emily Filbert is Medical Director for [place].  Dr. Ottie Glazier is Medical Director for [facility] Pulmonary Rehabilitation.

## 2022-01-28 ENCOUNTER — Other Ambulatory Visit: Payer: Self-pay

## 2022-01-28 DIAGNOSIS — Z9889 Other specified postprocedural states: Secondary | ICD-10-CM | POA: Diagnosis not present

## 2022-01-28 NOTE — Progress Notes (Signed)
Daily Session Note  Patient Details  Name: Melanie Simmons MRN: [id number] Date of Birth: 1941-01-16 Referring Provider:   Flowsheet Row Cardiac Rehab from 01/13/2022 in [facility] Cardiac and Pulmonary Rehab  Referring Provider Serafina Royals MD       Encounter Date: 01/28/2022  Check In:  Session Check In - 01/28/22 1116       Check-In   Supervising physician immediately available to respond to emergencies See telemetry face sheet for immediately available ER MD    Location [facility]-Cardiac & Pulmonary Rehab    Staff Present Birdie Sons, MPA, RN;Meredith Sherryll Burger, RN BSN;Melissa Caiola, RDN, LDN    Virtual Visit No    Medication changes reported     No    Fall or balance concerns reported    No    Warm-up and Cool-down Performed on first and last piece of equipment    Resistance Training Performed Yes    VAD Patient? No    PAD/SET Patient? No      Pain Assessment   Currently in Pain? No/denies                Social History   Tobacco Use  Smoking Status Former  Smokeless Tobacco Never    Goals Met:  Independence with exercise equipment Exercise tolerated well No report of concerns or symptoms today Strength training completed today  Goals Unmet:  Not Applicable  Comments: Pt able to follow exercise prescription today without complaint.  Will continue to monitor for progression.    Dr. Emily Filbert is Medical Director for [place].  Dr. Ottie Glazier is Medical Director for [facility] Pulmonary Rehabilitation.

## 2022-01-29 ENCOUNTER — Other Ambulatory Visit: Payer: Self-pay

## 2022-01-29 ENCOUNTER — Encounter: Payer: Medicare Other | Admitting: *Deleted

## 2022-01-29 DIAGNOSIS — Z9889 Other specified postprocedural states: Secondary | ICD-10-CM

## 2022-01-29 NOTE — Progress Notes (Signed)
Daily Session Note  Patient Details  Name: Melanie Simmons MRN: [id number] Date of Birth: Jun 07, 1941 Referring Provider:   Flowsheet Row Cardiac Rehab from 01/13/2022 in [facility] Cardiac and Pulmonary Rehab  Referring Provider Serafina Royals MD       Encounter Date: 01/29/2022  Check In:  Session Check In - 01/29/22 0928       Check-In   Supervising physician immediately available to respond to emergencies See telemetry face sheet for immediately available ER MD    Location [facility]-Cardiac & Pulmonary Rehab    Staff Present Renita Papa, RN BSN;Joseph [place], RCP,RRT,BSRT;Jessica [place], [place], RCEP, CCRP, CCET    Virtual Visit No    Medication changes reported     No    Fall or balance concerns reported    No    Warm-up and Cool-down Performed on first and last piece of equipment    Resistance Training Performed Yes    VAD Patient? No    PAD/SET Patient? No      Pain Assessment   Currently in Pain? No/denies                Social History   Tobacco Use  Smoking Status Former  Smokeless Tobacco Never    Goals Met:  Independence with exercise equipment Exercise tolerated well No report of concerns or symptoms today Strength training completed today  Goals Unmet:  Not Applicable  Comments: Pt able to follow exercise prescription today without complaint.  Will continue to monitor for progression.    Dr. Emily Filbert is Medical Director for [place].  Dr. Ottie Glazier is Medical Director for [facility] Pulmonary Rehabilitation.

## 2022-02-02 ENCOUNTER — Other Ambulatory Visit: Payer: Self-pay

## 2022-02-02 DIAGNOSIS — Z9889 Other specified postprocedural states: Secondary | ICD-10-CM

## 2022-02-02 NOTE — Progress Notes (Signed)
Daily Session Note ° °Patient Details  °Name: Melanie Simmons °MRN: [id number] °Date of Birth: 05/22/1941 °Referring Provider:   °Flowsheet Row Cardiac Rehab from 01/13/2022 in [facility] Cardiac and Pulmonary Rehab  °Referring Provider Kowalski, Bruce MD  ° °  ° ° °Encounter Date: 02/02/2022 ° °Check In: ° Session Check In - 02/02/22 1124   ° °  ° Check-In  ° Supervising physician immediately available to respond to emergencies See telemetry face sheet for immediately available ER MD   ° Location [facility]-Cardiac & Pulmonary Rehab   ° Staff Present Kelly Bollinger, MPA, RN;Jessica [place], MA, RCEP, CCRP, CCET;Amanda Sommer, BA, ACSM CEP, Exercise Physiologist   ° Virtual Visit No   ° Medication changes reported     No   ° Fall or balance concerns reported    No   ° Warm-up and Cool-down Performed on first and last piece of equipment   ° Resistance Training Performed Yes   ° VAD Patient? No   ° PAD/SET Patient? No   °  ° Pain Assessment  ° Currently in Pain? No/denies   ° °  °  ° °  ° ° ° ° ° °Social History  ° °Tobacco Use  °Smoking Status Former  °Smokeless Tobacco Never  ° ° °Goals Met:  °Independence with exercise equipment °Exercise tolerated well °No report of concerns or symptoms today °Strength training completed today ° °Goals Unmet:  °Not Applicable ° °Comments: Pt able to follow exercise prescription today without complaint.  Will continue to monitor for progression. ° ° ° °Dr. Mark Miller is Medical Director for [facility].  °Dr. Fuad Aleskerov is Medical Director for [facility] Pulmonary Rehabilitation. °

## 2022-02-04 ENCOUNTER — Other Ambulatory Visit: Payer: Self-pay

## 2022-02-04 DIAGNOSIS — Z9889 Other specified postprocedural states: Secondary | ICD-10-CM

## 2022-02-04 NOTE — Progress Notes (Signed)
Daily Session Note  Patient Details  Name: Melanie Simmons MRN: [id number] Date of Birth: [birth year] Referring Provider:   Flowsheet Row Cardiac Rehab from 01/13/2022 in [facility] Cardiac and Pulmonary Rehab  Referring Provider Serafina Royals MD       Encounter Date: 02/04/2022  Check In:  Session Check In - 02/04/22 1037       Check-In   Supervising physician immediately available to respond to emergencies See telemetry face sheet for immediately available ER MD    Location [facility]-Cardiac & Pulmonary Rehab    Staff Present Birdie Sons, MPA, RN;Melissa [place], RDN, Rowe Pavy, BA, ACSM CEP, Exercise Physiologist    Virtual Visit No    Medication changes reported     No    Fall or balance concerns reported    No    Warm-up and Cool-down Performed on first and last piece of equipment    Resistance Training Performed Yes    VAD Patient? No    PAD/SET Patient? No      Pain Assessment   Currently in Pain? No/denies                Social History   Tobacco Use  Smoking Status Former  Smokeless Tobacco Never    Goals Met:  Independence with exercise equipment Exercise tolerated well No report of concerns or symptoms today Strength training completed today  Goals Unmet:  Not Applicable  Comments: Pt able to follow exercise prescription today without complaint.  Will continue to monitor for progression.    Dr. Emily Filbert is Medical Director for [place].  Dr. Ottie Glazier is Medical Director for [facility] Pulmonary Rehabilitation.

## 2022-02-05 ENCOUNTER — Encounter: Payer: Medicare Other | Admitting: *Deleted

## 2022-02-05 ENCOUNTER — Other Ambulatory Visit: Payer: Self-pay

## 2022-02-05 DIAGNOSIS — Z9889 Other specified postprocedural states: Secondary | ICD-10-CM

## 2022-02-05 NOTE — Progress Notes (Signed)
Daily Session Note ° °Patient Details  °Name: Melanie Simmons °MRN: [id number] °Date of Birth: 12/06/1941 °Referring Provider:   °Flowsheet Row Cardiac Rehab from 01/13/2022 in [facility] Cardiac and Pulmonary Rehab  °Referring Provider Kowalski, Bruce MD  ° °  ° ° °Encounter Date: 02/05/2022 ° °Check In: ° Session Check In - 02/05/22 0930   ° °  ° Check-In  ° Supervising physician immediately available to respond to emergencies See telemetry face sheet for immediately available ER MD   ° Location [facility]-Cardiac & Pulmonary Rehab   ° Staff Present Meredith Craven, RN BSN;Joseph [place], RCP,RRT,BSRT;Jessica Hawkins, MA, RCEP, CCRP, CCET   ° Virtual Visit No   ° Medication changes reported     No   ° Fall or balance concerns reported    No   ° Warm-up and Cool-down Performed on first and last piece of equipment   ° Resistance Training Performed Yes   ° VAD Patient? No   ° PAD/SET Patient? No   °  ° Pain Assessment  ° Currently in Pain? No/denies   ° °  °  ° °  ° ° ° ° ° °Social History  ° °Tobacco Use  °Smoking Status Former  °Smokeless Tobacco Never  ° ° °Goals Met:  °Independence with exercise equipment °Exercise tolerated well °No report of concerns or symptoms today °Strength training completed today ° °Goals Unmet:  °Not Applicable ° °Comments: Pt able to follow exercise prescription today without complaint.  Will continue to monitor for progression. ° ° ° °Dr. Mark Miller is Medical Director for [facility].  °Dr. Fuad Aleskerov is Medical Director for [facility] Pulmonary [facility]. °

## 2022-02-09 ENCOUNTER — Other Ambulatory Visit: Payer: Self-pay

## 2022-02-09 DIAGNOSIS — Z9889 Other specified postprocedural states: Secondary | ICD-10-CM

## 2022-02-09 NOTE — Progress Notes (Signed)
Daily Session Note  Patient Details  Name: OZIE DIMARIA MRN: [id number] Date of Birth: 09-30-1941 Referring Provider:   Flowsheet Row Cardiac Rehab from 01/13/2022 in [facility]  Referring Provider Serafina Royals MD       Encounter Date: 02/09/2022  Check In:  Session Check In - 02/09/22 1126       Check-In   Supervising physician immediately available to respond to emergencies See telemetry face sheet for immediately available ER MD    Location [facility]    Staff Present Birdie Sons, MPA, RN;Amanda Sommer, BA, ACSM CEP, Exercise Physiologist;Jessica Luan Pulling, MA, RCEP, CCRP, CCET    Virtual Visit No    Medication changes reported     No    Fall or balance concerns reported    No    Warm-up and Cool-down Performed on first and last piece of equipment    Resistance Training Performed Yes    VAD Patient? No    PAD/SET Patient? No      Pain Assessment   Currently in Pain? No/denies                Social History   Tobacco Use  Smoking Status Former  Smokeless Tobacco Never    Goals Met:  Independence with exercise equipment Exercise tolerated well No report of concerns or symptoms today Strength training completed today  Goals Unmet:  Not Applicable  Comments: Pt able to follow exercise prescription today without complaint.  Will continue to monitor for progression.    Dr. Emily Filbert is Medical Director for [place].  Dr. Ottie Glazier is Medical Director for [facility].

## 2022-02-10 ENCOUNTER — Encounter: Payer: Self-pay | Admitting: *Deleted

## 2022-02-10 DIAGNOSIS — Z9889 Other specified postprocedural states: Secondary | ICD-10-CM

## 2022-02-10 NOTE — Progress Notes (Signed)
Cardiac Individual Treatment Plan  Patient Details  Name: Melanie Simmons MRN: [id number] Date of Birth: [birth year] Referring Provider:   Flowsheet Row Cardiac Rehab from 01/13/2022 in [facility] Cardiac and Pulmonary Rehab  Referring Provider Serafina Royals MD       Initial Encounter Date:  Flowsheet Row Cardiac Rehab from 01/13/2022 in [facility] Cardiac and Pulmonary Rehab  Date 01/13/22       Visit Diagnosis: S/P mitral valve repair  Patient's Home Medications on Admission:  Current Outpatient Medications:    aspirin 81 MG chewable tablet, Chew by mouth., Disp: , Rfl:    Biotin w/ Vitamins C & E (HAIR/SKIN/NAILS PO), Take 1 tablet by mouth in the morning., Disp: , Rfl:    Calcium Carb-Cholecalciferol (CALCIUM 500+D3 PO), Take 1 tablet by mouth in the morning., Disp: , Rfl:    Cholecalciferol (VITAMIN D3 PO), Take 1 tablet by mouth in the morning., Disp: , Rfl:    Multiple Minerals-Vitamins (CAL-MAG-ZINC-D PO), Take 1 tablet by mouth in the morning. (Patient not taking: Reported on 11/18/2021), Disp: , Rfl:    triamterene-hydrochlorothiazide (DYAZIDE) 37.5-25 MG capsule, Take 1 capsule by mouth in the morning., Disp: , Rfl:   Past Medical History: Past Medical History:  Diagnosis Date   Breast cancer ([place])    Hyperlipidemia    Hypertension    Motion sickness    ships   PONV (postoperative nausea and vomiting)    Vertigo     Tobacco Use: Social History   Tobacco Use  Smoking Status Former  Smokeless Tobacco Never    Labs: Recent Review Flowsheet Data   There is no flowsheet data to display.      Exercise Target Goals: Exercise Program Goal: Individual exercise prescription set using results from initial 6 min walk test and THRR while considering  patients activity barriers and safety.   Exercise Prescription Goal: Initial exercise prescription builds to 30-45 minutes a day of aerobic activity, 2-3 days per week.  Home exercise guidelines will be given to patient  during program as part of exercise prescription that the participant will acknowledge.   Education: Aerobic Exercise: - Group verbal and visual presentation on the components of exercise prescription. Introduces F.I.T.T principle from ACSM for exercise prescriptions.  Reviews F.I.T.T. principles of aerobic exercise including progression. Written material given at graduation.   Education: Resistance Exercise: - Group verbal and visual presentation on the components of exercise prescription. Introduces F.I.T.T principle from ACSM for exercise prescriptions  Reviews F.I.T.T. principles of resistance exercise including progression. Written material given at graduation.    Education: Exercise & Equipment Safety: - Individual verbal instruction and demonstration of equipment use and safety with use of the equipment. Flowsheet Row Cardiac Rehab from 02/04/2022 in [facility] Cardiac and Pulmonary Rehab  Education need identified 01/13/22  Date 01/13/22  Educator [place]  Instruction Review Code 1- Verbalizes Understanding       Education: Exercise Physiology & General Exercise Guidelines: - Group verbal and written instruction with models to review the exercise physiology of the cardiovascular system and associated critical values. Provides general exercise guidelines with specific guidelines to those with heart or lung disease.  Flowsheet Row Cardiac Rehab from 02/04/2022 in [facility] Cardiac and Pulmonary Rehab  Education need identified 01/13/22       Education: Flexibility, Balance, Mind/Body Relaxation: - Group verbal and visual presentation with interactive activity on the components of exercise prescription. Introduces F.I.T.T principle from ACSM for exercise prescriptions. Reviews F.I.T.T. principles of flexibility and balance exercise  training including progression. Also discusses the mind body connection.  Reviews various relaxation techniques to help reduce and manage stress (i.e. Deep breathing,  progressive muscle relaxation, and visualization). Balance handout provided to take home. Written material given at graduation.   Activity Barriers & Risk Stratification:  Activity Barriers & Cardiac Risk Stratification - 01/13/22 1546       Activity Barriers & Cardiac Risk Stratification   Activity Barriers Shortness of Breath;Deconditioning    Cardiac Risk Stratification Low             6 Minute Walk:  6 Minute Walk     Row Name 01/13/22 1622         6 Minute Walk   Phase Initial     Distance 985 feet     Walk Time 6 minutes     # of Rest Breaks 0     MPH 1.86     METS 1.93     RPE 11     Perceived Dyspnea  1     VO2 Peak 6.78     Symptoms No     Resting HR 90 bpm     Resting BP 112/64     Resting Oxygen Saturation  97 %     Exercise Oxygen Saturation  during 6 min walk 96 %     Max Ex. HR 104 bpm     Max Ex. BP 132/64     2 Minute Post BP 122/66              Oxygen Initial Assessment:   Oxygen Re-Evaluation:   Oxygen Discharge (Final Oxygen Re-Evaluation):   Initial Exercise Prescription:  Initial Exercise Prescription - 01/13/22 1600       Date of Initial Exercise RX and Referring Provider   Date 01/13/22    Referring Provider Serafina Royals MD      Oxygen   Maintain Oxygen Saturation 88% or higher      Treadmill   MPH 1.8    Grade 0.5    Minutes 15    METs 2.5      Recumbant Bike   Level 1    RPM 60    Minutes 15    METs 1.9      NuStep   Level 1    SPM 80    Minutes 15    METs 1.9      Prescription Details   Frequency (times per week) 3    Duration Progress to 30 minutes of continuous aerobic without signs/symptoms of physical distress      Intensity   THRR 40-80% of Max Heartrate 110 - 130    Ratings of Perceived Exertion 11-13    Perceived Dyspnea 0-4      Progression   Progression Continue to progress workloads to maintain intensity without signs/symptoms of physical distress.      Resistance Training    Training Prescription Yes    Weight 3 lb    Reps 10-15             Perform Capillary Blood Glucose checks as needed.  Exercise Prescription Changes:   Exercise Prescription Changes     Row Name 01/13/22 1600 01/25/22 1300 02/08/22 1400         Response to Exercise   Blood Pressure (Admit) 112/64 122/64 124/72     Blood Pressure (Exercise) 132/64 126/70 132/58     Blood Pressure (Exit) 122/66 108/62 120/64     Heart Rate (Admit) 90  bpm 96 bpm 90 bpm     Heart Rate (Exercise) 104 bpm 103 bpm 119 bpm     Heart Rate (Exit) 94 bpm 97 bpm 99 bpm     Oxygen Saturation (Admit) 97 % -- --     Oxygen Saturation (Exercise) 96 % -- --     Rating of Perceived Exertion (Exercise) _0 Perceived Dyspnea (Exercise) 1 -- --     Symptoms None -- --     Comments walk test results 3rd full day of exercise --     Duration -- Continue with 30 min of aerobic exercise without signs/symptoms of physical distress. Continue with 30 min of aerobic exercise without signs/symptoms of physical distress.     Intensity -- THRR unchanged THRR unchanged       Progression   Progression -- Continue to progress workloads to maintain intensity without signs/symptoms of physical distress. Continue to progress workloads to maintain intensity without signs/symptoms of physical distress.     Average METs -- 2.34 2.75       Resistance Training   Training Prescription -- Yes Yes     Weight -- 3 lb 4 lb     Reps -- 10-15 10-15       Interval Training   Interval Training -- No No       Treadmill   MPH -- 1.8 2.5     Grade -- 0.5 0.5     Minutes -- 15 15     METs -- 2.5 3.09       Recumbant Bike   Level -- 1 --     Minutes -- 15 --     METs -- 2.37 --       NuStep   Level -- 2 --     Minutes -- 15 --     METs -- 2.1 --       Arm Ergometer   Level -- 1 --     Minutes -- 15 --     METs -- 2.1 --       Elliptical   Level -- -- 1     Minutes -- -- 2       REL-XR   Level -- -- 5      Minutes -- -- 15     METs -- -- 2.4       Track   Laps -- 30 --     Minutes -- 15 --     METs -- 2.63 --              Exercise Comments:   Exercise Comments     Row Name 01/19/22 1104           Exercise Comments First full day of exercise!  Patient was oriented to gym and equipment including functions, settings, policies, and procedures.  Patient's individual exercise prescription and treatment plan were reviewed.  All starting workloads were established based on the results of the 6 minute walk test done at initial orientation visit.  The plan for exercise progression was also introduced and progression will be customized based on patient's performance and goals.                Exercise Goals and Review:   Exercise Goals     Row Name 01/13/22 1636             Exercise Goals   Increase Physical Activity Yes  Intervention Provide advice, education, support and counseling about physical activity/exercise needs.;Develop an individualized exercise prescription for aerobic and resistive training based on initial evaluation findings, risk stratification, comorbidities and participant's personal goals.       Expected Outcomes Short Term: Attend rehab on a regular basis to increase amount of physical activity.;Long Term: Add in home exercise to make exercise part of routine and to increase amount of physical activity.;Long Term: Exercising regularly at least 3-5 days a week.       Increase Strength and Stamina Yes       Intervention Provide advice, education, support and counseling about physical activity/exercise needs.;Develop an individualized exercise prescription for aerobic and resistive training based on initial evaluation findings, risk stratification, comorbidities and participant's personal goals.       Expected Outcomes Short Term: Increase workloads from initial exercise prescription for resistance, speed, and METs.;Short Term: Perform resistance training  exercises routinely during rehab and add in resistance training at home;Long Term: Improve cardiorespiratory fitness, muscular endurance and strength as measured by increased METs and functional capacity (6MWT)       Able to understand and use rate of perceived exertion (RPE) scale Yes       Intervention Provide education and explanation on how to use RPE scale       Expected Outcomes Short Term: Able to use RPE daily in rehab to express subjective intensity level;Long Term:  Able to use RPE to guide intensity level when exercising independently       Able to understand and use Dyspnea scale Yes       Intervention Provide education and explanation on how to use Dyspnea scale       Expected Outcomes Short Term: Able to use Dyspnea scale daily in rehab to express subjective sense of shortness of breath during exertion;Long Term: Able to use Dyspnea scale to guide intensity level when exercising independently       Knowledge and understanding of Target Heart Rate Range (THRR) Yes       Intervention Provide education and explanation of THRR including how the numbers were predicted and where they are located for reference       Expected Outcomes Short Term: Able to state/look up THRR;Long Term: Able to use THRR to govern intensity when exercising independently;Short Term: Able to use daily as guideline for intensity in rehab       Able to check pulse independently Yes       Intervention Provide education and demonstration on how to check pulse in carotid and radial arteries.;Review the importance of being able to check your own pulse for safety during independent exercise       Expected Outcomes Short Term: Able to explain why pulse checking is important during independent exercise;Long Term: Able to check pulse independently and accurately       Understanding of Exercise Prescription Yes       Intervention Provide education, explanation, and written materials on patient's individual exercise prescription        Expected Outcomes Short Term: Able to explain program exercise prescription;Long Term: Able to explain home exercise prescription to exercise independently                Exercise Goals Re-Evaluation :  Exercise Goals Re-Evaluation     Row Name 01/19/22 1104 01/25/22 1340 01/26/22 1115 02/08/22 1421 02/08/22 1425     Exercise Goal Re-Evaluation   Exercise Goals Review Increase Physical Activity;Knowledge and understanding of Target Heart Rate Range (THRR);Able  to understand and use rate of perceived exertion (RPE) scale;Understanding of Exercise Prescription;Increase Strength and Stamina;Able to understand and use Dyspnea scale;Able to check pulse independently Increase Physical Activity;Increase Strength and Stamina;Understanding of Exercise Prescription Increase Physical Activity;Increase Strength and Stamina;Understanding of Exercise Prescription;Able to understand and use rate of perceived exertion (RPE) scale;Able to understand and use Dyspnea scale;Knowledge and understanding of Target Heart Rate Range (THRR);Able to check pulse independently Increase Physical Activity;Increase Strength and Stamina --   Comments Reviewed RPE and dyspnea scales, THR and program prescription with pt today.  Pt voiced understanding and was given a copy of goals to take home. Melanie Simmons is off to a good start in rehab.  She has completed her first three full days of exercise thus far.  She has done well with them so far.  We will continue to monitor her progress. Melanie Simmons is doing well in rehab.  She is already walking some at home and ready to do more. Reviewed home exercise with pt today.  Pt plans to walking and a stationary bike at home for exercise.  We also talked about using our staff videos as she is not a fan of her bike.  Reviewed THR, pulse, RPE, sign and symptoms, pulse oximetery and when to call 911 or MD.  Also discussed weather considerations and indoor options.  Pt voiced understanding. Melanie Simmons is  progressing well.  She has increased speed on TM and has even tried the ellpitical!  She has also moved up to 4 lb for strength training. We will continue to monitor progress.   Expected Outcomes Short: Use RPE daily to regulate intensity. Long: Follow program prescription in THR. Short: Continue to attend rehab regularly Long: conitnue to follow program prescription Short; Start to add in more walking at home Long; Conitnue to build stamina -- Short:  reach 5 min on elliptical Long:  increase average MET level            Discharge Exercise Prescription (Final Exercise Prescription Changes):  Exercise Prescription Changes - 02/08/22 1400       Response to Exercise   Blood Pressure (Admit) 124/72    Blood Pressure (Exercise) 132/58    Blood Pressure (Exit) 120/64    Heart Rate (Admit) 90 bpm    Heart Rate (Exercise) 119 bpm    Heart Rate (Exit) 99 bpm    Rating of Perceived Exertion (Exercise) 15    Duration Continue with 30 min of aerobic exercise without signs/symptoms of physical distress.    Intensity THRR unchanged      Progression   Progression Continue to progress workloads to maintain intensity without signs/symptoms of physical distress.    Average METs 2.75      Resistance Training   Training Prescription Yes    Weight 4 lb    Reps 10-15      Interval Training   Interval Training No      Treadmill   MPH 2.5    Grade 0.5    Minutes 15    METs 3.09      Elliptical   Level 1    Minutes 2      REL-XR   Level 5    Minutes 15    METs 2.4             Nutrition:  Target Goals: Understanding of nutrition guidelines, daily intake of sodium <1532m, cholesterol <2057m calories 30% from fat and 7% or less from saturated fats, daily to have 5 or more  servings of fruits and vegetables.  Education: All About Nutrition: -Group instruction provided by verbal, written material, interactive activities, discussions, models, and posters to present general guidelines  for heart healthy nutrition including fat, fiber, MyPlate, the role of sodium in heart healthy nutrition, utilization of the nutrition label, and utilization of this knowledge for meal planning. Follow up email sent as well. Written material given at graduation. Flowsheet Row Cardiac Rehab from 02/04/2022 in [facility] Cardiac and Pulmonary Rehab  Education need identified 01/13/22       Biometrics:  Pre Biometrics - 01/13/22 1546       Pre Biometrics   Height 5' 8.8" (1.748 m)    Weight 184 lb 14.4 oz (83.9 kg)    BMI (Calculated) 27.45    Single Leg Stand 2 seconds              Nutrition Therapy Plan and Nutrition Goals:  Nutrition Therapy & Goals - 01/20/22 0750       Nutrition Therapy   RD appointment deferred Yes   Norabelle reports that she does not want to participate in an appointment with this RD until the middle-end of the program, will continue to check in.     Personal Nutrition Goals   Nutrition Goal Melanie Simmons reports that she does not want to participate in an appointment with this RD until the middle-end of the program, will continue to check in.             Nutrition Assessments:  MEDIFICTS Score Key: ?70 Need to make dietary changes  40-70 Heart Healthy Diet ? 40 Therapeutic Level Cholesterol Diet  Flowsheet Row Cardiac Rehab from 01/13/2022 in [facility] Cardiac and Pulmonary Rehab  Picture Your Plate Total Score on Admission 71      Picture Your Plate Scores: <30 Unhealthy dietary pattern with much room for improvement. 41-50 Dietary pattern unlikely to meet recommendations for good health and room for improvement. 51-60 More healthful dietary pattern, with some room for improvement.  >60 Healthy dietary pattern, although there may be some specific behaviors that could be improved.    Nutrition Goals Re-Evaluation:   Nutrition Goals Discharge (Final Nutrition Goals Re-Evaluation):   Psychosocial: Target Goals: Acknowledge presence or absence of  significant depression and/or stress, maximize coping skills, provide positive support system. Participant is able to verbalize types and ability to use techniques and skills needed for reducing stress and depression.   Education: Stress, Anxiety, and Depression - Group verbal and visual presentation to define topics covered.  Reviews how body is impacted by stress, anxiety, and depression.  Also discusses healthy ways to reduce stress and to treat/manage anxiety and depression.  Written material given at graduation. Flowsheet Row Cardiac Rehab from 02/04/2022 in [facility] Cardiac and Pulmonary Rehab  Date 02/04/22  Educator [facility]  Instruction Review Code 1- [employer] Understanding       Education: Sleep Hygiene -Provides group verbal and written instruction about how sleep can affect your health.  Define sleep hygiene, discuss sleep cycles and impact of sleep habits. Review good sleep hygiene tips.    Initial Review & Psychosocial Screening:  Initial Psych Review & Screening - 12/30/21 1536       Initial Review   Current issues with None Identified      Family Dynamics   Good Support System? Yes   neighbors     Barriers   Psychosocial barriers to participate in program There are no identifiable barriers or psychosocial needs.;The patient should benefit from training  in stress management and relaxation.      Screening Interventions   Interventions Encouraged to exercise;Provide feedback about the scores to participant;To provide support and resources with identified psychosocial needs    Expected Outcomes Short Term goal: Utilizing psychosocial counselor, staff and physician to assist with identification of specific Stressors or current issues interfering with healing process. Setting desired goal for each stressor or current issue identified.;Short Term goal: Identification and review with participant of any Quality of Life or Depression concerns found by scoring the questionnaire.;Long Term  Goal: Stressors or current issues are controlled or eliminated.;Long Term goal: The participant improves quality of Life and PHQ9 Scores as seen by post scores and/or verbalization of changes             Quality of Life Scores:   Quality of Life - 01/13/22 1553       Quality of Life   Select Quality of Life      Quality of Life Scores   Health/Function Pre 23.57 %    Socioeconomic Pre 30 %    Psych/Spiritual Pre 30 %    Family Pre 30 %    GLOBAL Pre 27.1 %            Scores of 19 and below usually indicate a poorer quality of life in these areas.  A difference of  2-3 points is a clinically meaningful difference.  A difference of 2-3 points in the total score of the Quality of Life Index has been associated with significant improvement in overall quality of life, self-image, physical symptoms, and general health in studies assessing change in quality of life.  PHQ-9: Recent Review Flowsheet Data     Depression screen [facility] 2/9 01/13/2022 05/27/2021   Decreased Interest 0 0   Down, Depressed, Hopeless 0 0   PHQ - 2 Score 0 0   Altered sleeping 0 2   Tired, decreased energy 1 0   Change in appetite 2 0   Feeling bad or failure about yourself  0 0   Trouble concentrating 0 1   Moving slowly or fidgety/restless 0 0   Suicidal thoughts 0 0   PHQ-9 Score 3 3   Difficult doing work/chores Not difficult at all Not difficult at all      Interpretation of Total Score  Total Score Depression Severity:  1-4 = Minimal depression, 5-9 = Mild depression, 10-14 = Moderate depression, 15-19 = Moderately severe depression, 20-27 = Severe depression   Psychosocial Evaluation and Intervention:  Psychosocial Evaluation - 12/30/21 1544       Psychosocial Evaluation & Interventions   Interventions Encouraged to exercise with the program and follow exercise prescription    Comments Melanie Simmons 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 She has felt a little more tired than usual and is ready to get back to  her active lifestyle. 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 She reports having a great support system and neighbors that have been helping with meals and such. Her appetite hasn't been the best since her surgery but she is trying to eat healthy snacks.    Expected Outcomes Short: attend cardiac rehab for education and exercise. Long: develop and maintain positive self care habits.    Continue Psychosocial Services  Follow up required by staff             Psychosocial Re-Evaluation:  Psychosocial Re-Evaluation     [place] Name 01/26/22 1116  Psychosocial Re-Evaluation   Current issues with Current Stress Concerns       Comments Melanie Simmons is off to good start in rehab.  She would like to get back to her golf game, but feels like hurting her club would be painful.  We talked about the best way to work on progression for golf game after surgery.  She is now sleeping on her right side again!  She is sleeping well again and feel good overall.  She is generally a positive person.       Expected Outcomes Short: Continue to work on golf game Long: COnitnue to stay positive       Interventions Encouraged to attend Cardiac Rehabilitation for the exercise;Stress management education       Continue Psychosocial Services  Follow up required by staff                Psychosocial Discharge (Final Psychosocial Re-Evaluation):  Psychosocial Re-Evaluation - 01/26/22 1116       Psychosocial Re-Evaluation   Current issues with Current Stress Concerns    Comments Melanie Simmons is off to good start in rehab.  She would like to get back to her golf game, but feels like hurting her club would be painful.  We talked about the best way to work on progression for golf game after surgery.  She is now sleeping on her right side again!  She is sleeping well again and feel good overall.  She is generally a positive person.    Expected Outcomes Short:  Continue to work on golf game Long: COnitnue to stay positive    Interventions Encouraged to attend Cardiac Rehabilitation for the exercise;Stress management education    Continue Psychosocial Services  Follow up required by staff             Vocational Rehabilitation: Provide vocational rehab assistance to qualifying candidates.   Vocational Rehab Evaluation & Intervention:  Vocational Rehab - 12/30/21 1544       Initial Vocational Rehab Evaluation & Intervention   Assessment shows need for Vocational Rehabilitation No             Education: Education Goals: Education classes will be provided on a variety of topics geared toward better understanding of heart health and risk factor modification. Participant will state understanding/return demonstration of topics presented as noted by education test scores.  Learning Barriers/Preferences:  Learning Barriers/Preferences - 12/30/21 1537       Learning Barriers/Preferences   Learning Barriers None    Learning Preferences None             General Cardiac Education Topics:  AED/CPR: - Group verbal and written instruction with the use of models to demonstrate the basic use of the AED with the basic ABC's of resuscitation.   Anatomy and Cardiac Procedures: - Group verbal and visual presentation and models provide information about basic cardiac anatomy and function. Reviews the testing methods done to diagnose heart disease and the outcomes of the test results. Describes the treatment choices: Medical Management, Angioplasty, or Coronary Bypass Surgery for treating various heart conditions including Myocardial Infarction, Angina, Valve Disease, and Cardiac Arrhythmias.  Written material given at graduation. Flowsheet Row Cardiac Rehab from 02/04/2022 in [facility] Cardiac and Pulmonary Rehab  Education need identified 01/13/22       Medication Safety: - Group verbal and visual instruction to review commonly prescribed  medications for heart and lung disease. Reviews the medication, class of the drug, and side effects. Includes the  steps to properly store meds and maintain the prescription regimen.  Written material given at graduation.   Intimacy: - Group verbal instruction through game format to discuss how heart and lung disease can affect sexual intimacy. Written material given at graduation..   Know Your Numbers and Heart Failure: - Group verbal and visual instruction to discuss disease risk factors for cardiac and pulmonary disease and treatment options.  Reviews associated critical values for Overweight/Obesity, Hypertension, Cholesterol, and Diabetes.  Discusses basics of heart failure: signs/symptoms and treatments.  Introduces Heart Failure Zone chart for action plan for heart failure.  Written material given at graduation. Flowsheet Row Cardiac Rehab from 02/04/2022 in [facility] Cardiac and Pulmonary Rehab  Date 01/21/22  Educator [facility]  Instruction Review Code 1- Verbalizes Understanding       Infection Prevention: - Provides verbal and written material to individual with discussion of infection control including proper hand washing and proper equipment cleaning during exercise session. Flowsheet Row Cardiac Rehab from 02/04/2022 in [facility] Cardiac and Pulmonary Rehab  Education need identified 01/13/22  Date 01/13/22  Educator [place]  Instruction Review Code 1- Verbalizes Understanding       Falls Prevention: - Provides verbal and written material to individual with discussion of falls prevention and safety. Flowsheet Row Cardiac Rehab from 02/04/2022 in [facility] Cardiac and Pulmonary Rehab  Education need identified 01/13/22  Date 01/13/22  Educator [place]  Instruction Review Code 1- Verbalizes Understanding       Other: -Provides group and verbal instruction on various topics (see comments)   Knowledge Questionnaire Score:  Knowledge Questionnaire Score - 01/13/22 1553       Knowledge  Questionnaire Score   Pre Score 23/26: Exercise, Angina, Nutrition             Core Components/Risk Factors/Patient Goals at Admission:  Personal Goals and Risk Factors at Admission - 01/13/22 1637       Core Components/Risk Factors/Patient Goals on Admission    Weight Management Yes;Weight Loss    Intervention Weight Management: Develop a combined nutrition and exercise program designed to reach desired caloric intake, while maintaining appropriate intake of nutrient and fiber, sodium and fats, and appropriate energy expenditure required for the weight goal.;Weight Management: Provide education and appropriate resources to help participant work on and attain dietary goals.;Weight Management/Obesity: Establish reasonable short term and long term weight goals.    Admit Weight 184 lb (83.5 kg)    Goal Weight: Short Term 180 lb (81.6 kg)    Goal Weight: Long Term 174 lb (78.9 kg)    Expected Outcomes Short Term: Continue to assess and modify interventions until short term weight is achieved;Long Term: Adherence to nutrition and physical activity/exercise program aimed toward attainment of established weight goal;Weight Loss: Understanding of general recommendations for a balanced deficit meal plan, which promotes 1-2 lb weight loss per week and includes a negative energy balance of [id number] kcal/d;Understanding recommendations for meals to include 15-35% energy as protein, 25-35% energy from fat, 35-60% energy from carbohydrates, less than 255m of dietary cholesterol, 20-35 gm of total fiber daily;Understanding of distribution of calorie intake throughout the day with the consumption of 4-5 meals/snacks    Hypertension Yes    Intervention Provide education on lifestyle modifcations including regular physical activity/exercise, weight management, moderate sodium restriction and increased consumption of fresh fruit, vegetables, and low fat dairy, alcohol moderation, and smoking cessation.;Monitor  prescription use compliance.    Expected Outcomes Short Term: Continued assessment and intervention until BP is <  140/96m HG in hypertensive participants. < 130/841mHG in hypertensive participants with diabetes, heart failure or chronic kidney disease.;Long Term: Maintenance of blood pressure at goal levels.    Lipids Yes    Intervention Provide education and support for participant on nutrition & aerobic/resistive exercise along with prescribed medications to achieve LDL <7080mHDL >29m82m  Expected Outcomes Short Term: Participant states understanding of desired cholesterol values and is compliant with medications prescribed. Participant is following exercise prescription and nutrition guidelines.;Long Term: Cholesterol controlled with medications as prescribed, with individualized exercise RX and with personalized nutrition plan. Value goals: LDL < 70mg62mL > 40 mg.             Education:Diabetes - Individual verbal and written instruction to review signs/symptoms of diabetes, desired ranges of glucose level fasting, after meals and with exercise. Acknowledge that pre and post exercise glucose checks will be done for 3 sessions at entry of program.   Core Components/Risk Factors/Patient Goals Review:   Goals and Risk Factor Review     Row Name 01/26/22 1121             Core Components/Risk Factors/Patient Goals Review   Personal Goals Review Weight Management/Obesity;Hypertension       Review Barba[place]anceoing well in rehab.  Her weight is holding steady. She would like to lose and will start to add in more walking at home.  She has noted a cough since surgery and she was encouraged to talk to her doctor about it.  She has finished her medication course and hopes it improves.  Her pressures have been good in class.  She had some trouble with low blood pressure while in hospital but has since been okay.  She does not check it at home.       Expected Outcomes Short: Continue to work  on weight loss Long: Conitnue to monitor risk factors                Core Components/Risk Factors/Patient Goals at Discharge (Final Review):   Goals and Risk Factor Review - 01/26/22 1121       Core Components/Risk Factors/Patient Goals Review   Personal Goals Review Weight Management/Obesity;Hypertension    Review [place] well in rehab.  Her weight is holding steady. She would like to lose and will start to add in more walking at home.  She has noted a cough since surgery and she was encouraged to talk to her doctor about it.  She has finished her medication course and hopes it improves.  Her pressures have been good in class.  She had some trouble with low blood pressure while in hospital but has since been okay.  She does not check it at home.    Expected Outcomes Short: Continue to work on weight loss Long: Conitnue to monitor risk factors             ITP Comments:  ITP Comments     Row Name 12/30/21 1541 01/13/22 1538 01/19/22 1103 02/10/22 0857     ITP Comments Initial telephone orientation completed. Diagnosis can be found in [facility]. EP orientation scheduled for Wednesday 1/25 at 2pm. Completed 6MWT and gym orientation. Initial ITP created and sent for review to Dr. Mark Emily Filbertical Director. First full day of exercise!  Patient was oriented to gym and equipment including functions, settings, policies, and procedures.  Patient's individual exercise prescription and treatment plan were reviewed.  All starting workloads were established  based on the results of the 6 minute walk test done at initial orientation visit.  The plan for exercise progression was also introduced and progression will be customized based on patient's performance and goals. 30 Day review completed. Medical Director ITP review done, changes made as directed, and signed approval by Medical Director.             Comments:

## 2022-02-11 ENCOUNTER — Other Ambulatory Visit: Payer: Self-pay

## 2022-02-11 DIAGNOSIS — Z9889 Other specified postprocedural states: Secondary | ICD-10-CM

## 2022-02-11 NOTE — Progress Notes (Signed)
Daily Session Note  Patient Details  Name: Melanie Simmons MRN: [id number] Date of Birth: [birth year] Referring Provider:   Flowsheet Row Cardiac Rehab from [facility]/13/2022 in [facility] Cardiac and Pulmonary Rehab  Referring Provider Serafina Royals MD       Encounter Date: 02/11/2022  Check In:  Session Check In - 02/11/22 0914       Check-In   Supervising physician immediately available to respond to emergencies See telemetry face sheet for immediately available ER MD    Location [facility]-Cardiac & Pulmonary Rehab    Staff Present Birdie Sons, MPA, Mauricia Area, BS, ACSM CEP, Exercise Physiologist;Melissa Caiola, RDN, LDN    Virtual Visit No    Medication changes reported     No    Fall or balance concerns reported    No    Warm-up and Cool-down Performed on first and last piece of equipment    Resistance Training Performed Yes    VAD Patient? No    PAD/SET Patient? No      Pain Assessment   Currently in Pain? No/denies                Social History   Tobacco Use  Smoking Status Former  Smokeless Tobacco Never    Goals Met:  Independence with exercise equipment Exercise tolerated well No report of concerns or symptoms today Strength training completed today  Goals Unmet:  Not Applicable  Comments: Pt able to follow exercise prescription today without complaint.  Will continue to monitor for progression.    Dr. Emily Filbert is Medical Director for [place].  Dr. Ottie Glazier is Medical Director for [facility] Pulmonary Rehabilitation.

## 2022-02-12 ENCOUNTER — Other Ambulatory Visit: Payer: Self-pay

## 2022-02-12 ENCOUNTER — Encounter: Payer: Medicare Other | Admitting: *Deleted

## 2022-02-12 DIAGNOSIS — Z9889 Other specified postprocedural states: Secondary | ICD-10-CM | POA: Diagnosis not present

## 2022-02-12 NOTE — Progress Notes (Signed)
Daily Session Note  Patient Details  Name: Melanie Simmons MRN: [id number] Date of Birth: Nov 24, 1941 Referring Provider:   Flowsheet Row Cardiac Rehab from 01/13/2022 in [facility] Cardiac and Pulmonary Rehab  Referring Provider Serafina Royals MD       Encounter Date: 02/12/2022  Check In:  Session Check In - 02/12/22 0933       Check-In   Supervising physician immediately available to respond to emergencies See telemetry face sheet for immediately available ER MD    Location [facility]-Cardiac & Pulmonary Rehab    Staff Present Renita Papa, RN BSN;Joseph Tessie Fass, RCP,RRT,BSRT;Melissa [place], [place], LDN    Virtual Visit No    Medication changes reported     No    Fall or balance concerns reported    No    Warm-up and Cool-down Performed on first and last piece of equipment    Resistance Training Performed Yes    VAD Patient? No    PAD/SET Patient? No      Pain Assessment   Currently in Pain? No/denies                Social History   Tobacco Use  Smoking Status Former  Smokeless Tobacco Never    Goals Met:  Independence with exercise equipment Exercise tolerated well No report of concerns or symptoms today Strength training completed today  Goals Unmet:  Not Applicable  Comments: Pt able to follow exercise prescription today without complaint.  Will continue to monitor for progression.    Dr. Emily Filbert is Medical Director for [place].  Dr. Ottie Glazier is Medical Director for [facility] Pulmonary Rehabilitation.

## 2022-02-16 ENCOUNTER — Other Ambulatory Visit: Payer: Self-pay

## 2022-02-16 DIAGNOSIS — Z9889 Other specified postprocedural states: Secondary | ICD-10-CM | POA: Diagnosis not present

## 2022-02-16 NOTE — Progress Notes (Signed)
Daily Session Note  Patient Details  Name: Melanie Simmons MRN: [id number] Date of Birth: [birth year] Referring Provider:   Flowsheet Row Cardiac Rehab from 01/13/2022 in [facility] Cardiac and Pulmonary Rehab  Referring Provider Serafina Royals MD       Encounter Date: 02/16/2022  Check In:  Session Check In - 02/16/22 0923       Check-In   Supervising physician immediately available to respond to emergencies See telemetry face sheet for immediately available ER MD    Location [facility]-Cardiac & Pulmonary Rehab    Staff Present Birdie Sons, MPA, RN;Melissa [place], RDN, LDN;Jessica [place], MA, RCEP, CCRP, CCET;Amanda Sommer, BA, ACSM CEP, Exercise Physiologist    Virtual Visit No    Medication changes reported     No    Fall or balance concerns reported    No    Warm-up and Cool-down Performed on first and last piece of equipment    Resistance Training Performed Yes    VAD Patient? No    PAD/SET Patient? No      Pain Assessment   Currently in Pain? No/denies                Social History   Tobacco Use  Smoking Status Former  Smokeless Tobacco Never    Goals Met:  Independence with exercise equipment Exercise tolerated well No report of concerns or symptoms today Strength training completed today  Goals Unmet:  Not Applicable  Comments: Pt able to follow exercise prescription today without complaint.  Will continue to monitor for progression.    Dr. Emily Filbert is Medical Director for [place].  Dr. Ottie Glazier is Medical Director for [facility] Pulmonary Rehabilitation.

## 2022-02-18 ENCOUNTER — Encounter: Payer: Medicare Other | Attending: Internal Medicine

## 2022-02-18 ENCOUNTER — Other Ambulatory Visit: Payer: Self-pay

## 2022-02-18 DIAGNOSIS — Z9889 Other specified postprocedural states: Secondary | ICD-10-CM | POA: Insufficient documentation

## 2022-02-18 NOTE — Progress Notes (Signed)
Daily Session Note ? ?Patient Details  ?Name: Melanie Simmons ?MRN: [id number] ?Date of Birth: [birth year] ?Referring Provider:   ?Flowsheet Row Cardiac Rehab from 01/13/2022 in [facility] Cardiac and Pulmonary Rehab  ?Referring Provider Serafina Royals MD  ? ?  ? ? ?Encounter Date: 02/18/2022 ? ?Check In: ? Session Check In - 02/18/22 0920   ? ?  ? Check-In  ? Supervising physician immediately available to respond to emergencies See telemetry face sheet for immediately available ER MD   ? Location [facility]-Cardiac & Pulmonary Rehab   ? Staff Present Birdie Sons, MPA, RN;Ashten Prats Amedeo Plenty, BS, ACSM CEP, Exercise Physiologist;Jessica Luan Pulling, MA, RCEP, CCRP, CCET   ? Virtual Visit No   ? Medication changes reported     No   ? Fall or balance concerns reported    No   ? Warm-up and Cool-down Performed on first and last piece of equipment   ? Resistance Training Performed Yes   ? VAD Patient? No   ? PAD/SET Patient? No   ?  ? Pain Assessment  ? Currently in Pain? No/denies   ? ?  ?  ? ?  ? ? ? ? ? ?Social History  ? ?Tobacco Use  ?Smoking Status Former  ?Smokeless Tobacco Never  ? ? ?Goals Met:  ?Independence with exercise equipment ?Exercise tolerated well ?No report of concerns or symptoms today ?Strength training completed today ? ?Goals Unmet:  ?Not Applicable ? ?Comments: Pt able to follow exercise prescription today without complaint.  Will continue to monitor for progression. ? ? ? ?Dr. Emily Filbert is Medical Director for [place].  ?Dr. Ottie Glazier is Medical Director for [facility] Pulmonary Rehabilitation. ?

## 2022-02-23 ENCOUNTER — Other Ambulatory Visit: Payer: Self-pay

## 2022-02-23 DIAGNOSIS — Z9889 Other specified postprocedural states: Secondary | ICD-10-CM

## 2022-02-23 NOTE — Progress Notes (Signed)
Daily Session Note ? ?Patient Details  ?Name: Melanie Simmons ?MRN: [id number] ?Date of Birth: 10/19/1941 ?Referring Provider:   ?Flowsheet Row Cardiac Rehab from 01/13/2022 in [facility] Cardiac and Pulmonary Rehab  ?Referring Provider Serafina Royals MD  ? ?  ? ? ?Encounter Date: 02/23/2022 ? ?Check In: ? Session Check In - 02/23/22 0913   ? ?  ? Check-In  ? Supervising physician immediately available to respond to emergencies See telemetry face sheet for immediately available ER MD   ? Location [facility]-Cardiac & Pulmonary Rehab   ? Staff Present Birdie Sons, MPA, RN;Amanda Sommer, BA, ACSM CEP, Exercise Physiologist;Jessica Lu[place], MA, RCEP, CCRP, CCET   ? Virtual Visit No   ? Medication changes reported     No   ? Fall or balance concerns reported    No   ? Warm-up and Cool-down Performed on first and last piece of equipment   ? Resistance Training Performed Yes   ? VAD Patient? No   ? PAD/SET Patient? No   ?  ? Pain Assessment  ? Currently in Pain? No/denies   ? ?  ?  ? ?  ? ? ? ? ? ?Social History  ? ?Tobacco Use  ?Smoking Status Former  ?Smokeless Tobacco Never  ? ? ?Goals Met:  ?Independence with exercise equipment ?Exercise tolerated well ?No report of concerns or symptoms today ?Strength training completed today ? ?Goals Unmet:  ?Not Applicable ? ?Comments: Pt able to follow exercise prescription today without complaint.  Will continue to monitor for progression. ? ? ? ?Dr. Emily Filbert is Medical Director for [place].  ?Dr. Ottie Glazier is Medical Director for [facility] Pulmonary Rehabilitation. ?

## 2022-02-25 ENCOUNTER — Other Ambulatory Visit: Payer: Self-pay

## 2022-02-25 DIAGNOSIS — Z9889 Other specified postprocedural states: Secondary | ICD-10-CM | POA: Diagnosis not present

## 2022-02-25 NOTE — Progress Notes (Signed)
Daily Session Note ? ?Patient Details  ?Name: Melanie Simmons ?MRN: [id number] ?Date of Birth: 07/10/1941 ?Referring Provider:   ?Flowsheet Row Cardiac Rehab from 01/13/2022 in [facility] Cardiac and Pulmonary Rehab  ?Referring Provider Kowalski, Bruce MD  ? ?  ? ? ?Encounter Date: 02/25/2022 ? ?Check In: ? Session Check In - 02/25/22 0920   ? ?  ? Check-In  ? Supervising physician immediately available to respond to emergencies See telemetry face sheet for immediately available ER MD   ? Location [facility]-Cardiac & Pulmonary Rehab   ? Staff Present Kelly Bollinger, MPA, RN;Amanda Sommer, BA, ACSM CEP, Exercise Physiologist;Kelly Hayes, BS, ACSM CEP, Exercise Physiologist;Jessica [place], MA, RCEP, CCRP, CCET;Kara Langdon, MS, ASCM CEP, Exercise Physiologist   ? Virtual Visit No   ? Medication changes reported     No   ? Fall or balance concerns reported    No   ? Warm-up and Cool-down Performed on first and last piece of equipment   ? Resistance Training Performed Yes   ? VAD Patient? No   ? PAD/SET Patient? No   ?  ? Pain Assessment  ? Currently in Pain? No/denies   ? ?  ?  ? ?  ? ? ? ? ? ?Social History  ? ?Tobacco Use  ?Smoking Status Former  ?Smokeless Tobacco Never  ? ? ?Goals Met:  ?Independence with exercise equipment ?Exercise tolerated well ?No report of concerns or symptoms today ?Strength training completed today ? ?Goals Unmet:  ?Not Applicable ? ?Comments: Pt able to follow exercise prescription today without complaint.  Will continue to monitor for progression. ? ? ? ?Dr. Mark Miller is Medical Director for [facility] Cardiac Rehabilitation.  ?Dr. Fuad Aleskerov is Medical Director for [facility] Pulmonary Rehabilitation. ?

## 2022-03-02 ENCOUNTER — Other Ambulatory Visit: Payer: Self-pay

## 2022-03-02 DIAGNOSIS — Z9889 Other specified postprocedural states: Secondary | ICD-10-CM | POA: Diagnosis not present

## 2022-03-02 NOTE — Progress Notes (Signed)
Daily Session Note ? ?Patient Details  ?Name: Melanie Simmons ?MRN: [id number] ?Date of Birth: 07-08-1941 ?Referring Provider:   ?Flowsheet Row Cardiac Rehab from 01/13/2022 in [facility] Cardiac and Pulmonary Rehab  ?Referring Provider Serafina Royals MD  ? ?  ? ? ?Encounter Date: 03/02/2022 ? ?Check In: ? Session Check In - 03/02/22 0916   ? ?  ? Check-In  ? Supervising physician immediately available to respond to emergencies See telemetry face sheet for immediately available ER MD   ? Location [facility]-Cardiac & Pulmonary Rehab   ? Staff Present Birdie Sons, MPA, RN;Amanda Sommer, BA, ACSM CEP, Exercise Physiologist;Jessica Luan Pulling, MA, RCEP, CCRP, CCET   ? Virtual Visit No   ? Medication changes reported     No   ? Fall or balance concerns reported    No   ? Warm-up and Cool-down Performed on first and last piece of equipment   ? Resistance Training Performed Yes   ? VAD Patient? No   ? PAD/SET Patient? No   ?  ? Pain Assessment  ? Currently in Pain? No/denies   ? ?  ?  ? ?  ? ? ? ? ? ?Social History  ? ?Tobacco Use  ?Smoking Status Former  ?Smokeless Tobacco Never  ? ? ?Goals Met:  ?Independence with exercise equipment ?Exercise tolerated well ?No report of concerns or symptoms today ?Strength training completed today ? ?Goals Unmet:  ?Not Applicable ? ?Comments: Pt able to follow exercise prescription today without complaint.  Will continue to monitor for progression. ? ? ? ?Dr. Emily Filbert is Medical Director for [place].  ?Dr. Ottie Glazier is Medical Director for [facility] Pulmonary Rehabilitation. ?

## 2022-03-04 ENCOUNTER — Other Ambulatory Visit: Payer: Self-pay

## 2022-03-04 DIAGNOSIS — Z9889 Other specified postprocedural states: Secondary | ICD-10-CM | POA: Diagnosis not present

## 2022-03-04 NOTE — Progress Notes (Signed)
Daily Session Note ? ?Patient Details  ?Name: Melanie Simmons ?MRN: [id number] ?Date of Birth: 1941-06-23 ?Referring Provider:   ?Flowsheet Row Cardiac Rehab from 01/13/2022 in [facility] Cardiac and Pulmonary Rehab  ?Referring Provider Serafina Royals MD  ? ?  ? ? ?Encounter Date: 03/04/2022 ? ?Check In: ? Session Check In - 03/04/22 0918   ? ?  ? Check-In  ? Supervising physician immediately available to respond to emergencies See telemetry face sheet for immediately available ER MD   ? Location [facility]-Cardiac & Pulmonary Rehab   ? Staff Present Birdie Sons, MPA, RN;Kema Santaella Amedeo Plenty, BS, ACSM CEP, Exercise Physiologist;Jessica Luan Pulling, MA, RCEP, CCRP, CCET   ? Virtual Visit No   ? Medication changes reported     No   ? Fall or balance concerns reported    No   ? Warm-up and Cool-down Performed on first and last piece of equipment   ? Resistance Training Performed Yes   ? VAD Patient? No   ? PAD/SET Patient? No   ?  ? Pain Assessment  ? Currently in Pain? No/denies   ? ?  ?  ? ?  ? ? ? ? ? ?Social History  ? ?Tobacco Use  ?Smoking Status Former  ?Smokeless Tobacco Never  ? ? ?Goals Met:  ?Independence with exercise equipment ?Exercise tolerated well ?No report of concerns or symptoms today ?Strength training completed today ? ?Goals Unmet:  ?Not Applicable ? ?Comments: Pt able to follow exercise prescription today without complaint.  Will continue to monitor for progression. ? ? ? ?Dr. Emily Filbert is Medical Director for [place].  ?Dr. Ottie Glazier is Medical Director for [facility] Pulmonary Rehabilitation. ?

## 2022-03-05 ENCOUNTER — Encounter: Payer: Medicare Other | Admitting: *Deleted

## 2022-03-05 ENCOUNTER — Other Ambulatory Visit: Payer: Self-pay

## 2022-03-05 DIAGNOSIS — Z9889 Other specified postprocedural states: Secondary | ICD-10-CM | POA: Diagnosis not present

## 2022-03-05 NOTE — Progress Notes (Signed)
Daily Session Note ? ?Patient Details  ?Name: JUDEA FENNIMORE ?MRN: [id number] ?Date of Birth: 31-Dec-1940 ?Referring Provider:   ?Flowsheet Row Cardiac Rehab from 01/13/2022 in [facility] Cardiac and Pulmonary Rehab  ?Referring Provider Serafina Royals MD  ? ?  ? ? ?Encounter Date: 03/05/2022 ? ?Check In: ? Session Check In - 03/05/22 0927   ? ?  ? Check-In  ? Supervising physician immediately available to respond to emergencies See telemetry face sheet for immediately available ER MD   ? Location [facility]-Cardiac & Pulmonary Rehab   ? Staff Present Renita Papa, RN BSN;Joseph [place], RCP,RRT,BSRT;Jessica [place], [place], [place], [place], CCET   ? Virtual Visit No   ? Medication changes reported     No   ? Fall or balance concerns reported    No   ? Warm-up and Cool-down Performed on first and last piece of equipment   ? Resistance Training Performed Yes   ? VAD Patient? No   ? PAD/SET Patient? No   ?  ? Pain Assessment  ? Currently in Pain? No/denies   ? ?  ?  ? ?  ? ? ? ? ? ?Social History  ? ?Tobacco Use  ?Smoking Status Former  ?Smokeless Tobacco Never  ? ? ?Goals Met:  ?Independence with exercise equipment ?Exercise tolerated well ?No report of concerns or symptoms today ?Strength training completed today ? ?Goals Unmet:  ?Not Applicable ? ?Comments: Pt able to follow exercise prescription today without complaint.  Will continue to monitor for progression. ? ? ? ?Dr. Emily Filbert is Medical Director for [place].  ?Dr. Ottie Glazier is Medical Director for [facility] Pulmonary Rehabilitation. ?

## 2022-03-09 ENCOUNTER — Other Ambulatory Visit: Payer: Self-pay

## 2022-03-09 DIAGNOSIS — Z9889 Other specified postprocedural states: Secondary | ICD-10-CM | POA: Diagnosis not present

## 2022-03-09 NOTE — Progress Notes (Signed)
Daily Session Note ? ?Patient Details  ?Name: Melanie Simmons ?MRN: [id number] ?Date of Birth: 1941-10-30 ?Referring Provider:   ?Flowsheet Row Cardiac Rehab from 01/13/2022 in [facility] Cardiac and Pulmonary Rehab  ?Referring Provider Serafina Royals MD  ? ?  ? ? ?Encounter Date: 03/09/2022 ? ?Check In: ? Session Check In - 03/09/22 0928   ? ?  ? Check-In  ? Supervising physician immediately available to respond to emergencies See telemetry face sheet for immediately available ER MD   ? Location [facility]-Cardiac & Pulmonary Rehab   ? Staff Present Birdie Sons, MPA, RN;Amanda Sommer, BA, ACSM CEP, Exercise Physiologist;Jessica Luan Pulling, MA, RCEP, CCRP, CCET   ? Virtual Visit No   ? Medication changes reported     No   ? Fall or balance concerns reported    No   ? Warm-up and Cool-down Performed on first and last piece of equipment   ? Resistance Training Performed Yes   ? VAD Patient? No   ? PAD/SET Patient? No   ?  ? Pain Assessment  ? Currently in Pain? No/denies   ? ?  ?  ? ?  ? ? ? ? ? ?Social History  ? ?Tobacco Use  ?Smoking Status Former  ?Smokeless Tobacco Never  ? ? ?Goals Met:  ?Independence with exercise equipment ?Exercise tolerated well ?No report of concerns or symptoms today ?Strength training completed today ? ?Goals Unmet:  ?Not Applicable ? ?Comments: Pt able to follow exercise prescription today without complaint.  Will continue to monitor for progression. ? ? ? ?Dr. Emily Filbert is Medical Director for [place].  ?Dr. Ottie Glazier is Medical Director for [facility] Pulmonary Rehabilitation. ?

## 2022-03-10 ENCOUNTER — Encounter: Payer: Self-pay | Admitting: *Deleted

## 2022-03-10 DIAGNOSIS — Z9889 Other specified postprocedural states: Secondary | ICD-10-CM

## 2022-03-10 NOTE — Progress Notes (Signed)
Cardiac Individual Treatment Plan ? ?Patient Details  ?Name: Melanie Simmons ?MRN: [id number] ?Date of Birth: Jun 15, 1941 ?Referring Provider:   ?Flowsheet Row Cardiac Rehab from 01/13/2022 in [facility] Cardiac and Pulmonary Rehab  ?Referring Provider Serafina Royals MD  ? ?  ? ? ?Initial Encounter Date:  ?Flowsheet Row Cardiac Rehab from 01/13/2022 in [facility] Cardiac and Pulmonary Rehab  ?Date 01/13/22  ? ?  ? ? ?Visit Diagnosis: S/P mitral valve repair ? ?Patient's Home Medications on Admission: ? ?Current Outpatient Medications:  ?  aspirin 81 MG chewable tablet, Chew by mouth., Disp: , Rfl:  ?  Biotin w/ Vitamins C & E (HAIR/SKIN/NAILS PO), Take 1 tablet by mouth in the morning., Disp: , Rfl:  ?  Calcium Carb-Cholecalciferol (CALCIUM 500+D3 PO), Take 1 tablet by mouth in the morning., Disp: , Rfl:  ?  Cholecalciferol (VITAMIN D3 PO), Take 1 tablet by mouth in the morning., Disp: , Rfl:  ?  Multiple Minerals-Vitamins (CAL-MAG-ZINC-D PO), Take 1 tablet by mouth in the morning. (Patient not taking: Reported on 11/18/2021), Disp: , Rfl:  ?  triamterene-hydrochlorothiazide (DYAZIDE) 37.5-25 MG capsule, Take 1 capsule by mouth in the morning., Disp: , Rfl:  ? ?Past Medical History: ?Past Medical History:  ?Diagnosis Date  ? Breast cancer ([place])   ? Hyperlipidemia   ? Hypertension   ? Motion sickness   ? ships  ? PONV (postoperative nausea and vomiting)   ? Vertigo   ? ? ?Tobacco Use: ?Social History  ? ?Tobacco Use  ?Smoking Status Former  ?Smokeless Tobacco Never  ? ? ?Labs: ?Review Flowsheet   ? ?    ? View : No data to display.  ?  ?  ?  ?  ?  ? ? ? ?Exercise Target Goals: ?Exercise Program Goal: ?Individual exercise prescription set using results from initial 6 min walk test and THRR while considering  patient?s activity barriers and safety.  ? ?Exercise Prescription Goal: ?Initial exercise prescription builds to 30-45 minutes a day of aerobic activity, 2-3 days per week.  Home exercise guidelines will be given to patient  during program as part of exercise prescription that the participant will acknowledge. ? ? ?Education: Aerobic Exercise: ?- Group verbal and visual presentation on the components of exercise prescription. Introduces F.I.T.T principle from ACSM for exercise prescriptions.  Reviews F.I.T.T. principles of aerobic exercise including progression. Written material given at graduation. ?Flowsheet Row Cardiac Rehab from 03/04/2022 in [facility] Cardiac and Pulmonary Rehab  ?Date 02/18/22  ?Educator AS  ?Instruction Review Code 1- Verbalizes Understanding  ? ?  ? ? ?Education: Resistance Exercise: ?- Group verbal and visual presentation on the components of exercise prescription. Introduces F.I.T.T principle from ACSM for exercise prescriptions  Reviews F.I.T.T. principles of resistance exercise including progression. Written material given at graduation. ?Flowsheet Row Cardiac Rehab from 03/04/2022 in [facility] Cardiac and Pulmonary Rehab  ?Date 02/25/22  ?Educator AS  ?Instruction Review Code 1- Verbalizes Understanding  ? ?  ? ?  ?Education: Exercise & Equipment Safety: ?- Individual verbal instruction and demonstration of equipment use and safety with use of the equipment. ?Flowsheet Row Cardiac Rehab from 03/04/2022 in [facility] Cardiac and Pulmonary Rehab  ?Education need identified 01/13/22  ?Date 01/13/22  ?Educator KL  ?Instruction Review Code 1- Verbalizes Understanding  ? ?  ? ? ?Education: Exercise Physiology & General Exercise Guidelines: ?- Group verbal and written instruction with models to review the exercise physiology of the cardiovascular system and associated critical values. Provides general exercise guidelines  with specific guidelines to those with heart or lung disease.  ?Flowsheet Row Cardiac Rehab from 03/04/2022 in [facility] Cardiac and Pulmonary Rehab  ?Education need identified 01/13/22  ? ?  ? ? ?Education: Flexibility, Balance, Mind/Body Relaxation: ?- Group verbal and visual presentation with interactive activity on  the components of exercise prescription. Introduces F.I.T.T principle from ACSM for exercise prescriptions. Reviews F.I.T.T. principles of flexibility and balance exercise training including progression. Also discusses the mind body connection.  Reviews various relaxation techniques to help reduce and manage stress (i.e. Deep breathing, progressive muscle relaxation, and visualization). Balance handout provided to take home. Written material given at graduation. ? ? ?Activity Barriers & Risk Stratification: ? Activity Barriers & Cardiac Risk Stratification - 01/13/22 1546   ? ?  ? Activity Barriers & Cardiac Risk Stratification  ? Activity Barriers Shortness of Breath;Deconditioning   ? Cardiac Risk Stratification Low   ? ?  ?  ? ?  ? ? ?6 Minute Walk: ? 6 Minute Walk   ? ? [place] Name 01/13/22 1622  ?  ?  ?  ? 6 Minute Walk  ? Phase Initial    ? Distance 985 feet    ? Walk Time 6 minutes    ? # of Rest Breaks 0    ? MPH 1.86    ? METS 1.93    ? RPE 11    ? Perceived Dyspnea  1    ? VO2 Peak 6.78    ? Symptoms No    ? Resting HR 90 bpm    ? Resting BP 112/64    ? Resting Oxygen Saturation  97 %    ? Exercise Oxygen Saturation  during 6 min walk 96 %    ? Max Ex. HR 104 bpm    ? Max Ex. BP 132/64    ? 2 Minute Post BP 122/66    ? ?  ?  ? ?  ? ? ?Oxygen Initial Assessment: ? ? ?Oxygen Re-Evaluation: ? ? ?Oxygen Discharge (Final Oxygen Re-Evaluation): ? ? ?Initial Exercise Prescription: ? Initial Exercise Prescription - 01/13/22 1600   ? ?  ? Date of Initial Exercise RX and Referring Provider  ? Date 01/13/22   ? Referring Provider Serafina Royals MD   ?  ? Oxygen  ? Maintain Oxygen Saturation 88% or higher   ?  ? Treadmill  ? MPH 1.8   ? Grade 0.5   ? Minutes 15   ? METs 2.5   ?  ? Recumbant Bike  ? Level 1   ? RPM 60   ? Minutes 15   ? METs 1.9   ?  ? NuStep  ? Level 1   ? SPM 80   ? Minutes 15   ? METs 1.9   ?  ? Prescription Details  ? Frequency (times per week) 3   ? Duration Progress to 30 minutes of continuous  aerobic without signs/symptoms of physical distress   ?  ? Intensity  ? THRR 40-80% of Max Heartrate 110 - 130   ? Ratings of Perceived Exertion 11-13   ? Perceived Dyspnea 0-4   ?  ? Progression  ? Progression Continue to progress workloads to maintain intensity without signs/symptoms of physical distress.   ?  ? Resistance Training  ? Training Prescription Yes   ? Weight 3 lb   ? Reps 10-15   ? ?  ?  ? ?  ? ? ?Perform Capillary Blood Glucose checks  as needed. ? ?Exercise Prescription Changes: ? ? Exercise Prescription Changes   ? ? [place] Name 01/13/22 1600 01/25/22 1300 02/08/22 1400 02/22/22 0800 03/08/22 0800  ?  ? Response to Exercise  ? Blood Pressure (Admit) 112/64 122/64 124/72 146/74 112/58  ? Blood Pressure (Exercise) 132/64 126/70 132/58 -- --  ? Blood Pressure (Exit) 122/66 108/62 120/64 124/72 102/60  ? Heart Rate (Admit) 90 bpm 96 bpm 90 bpm 87 bpm 90 bpm  ? Heart Rate (Exercise) 104 bpm 103 bpm 119 bpm 116 bpm 103 bpm  ? Heart Rate (Exit) 94 bpm 97 bpm 99 bpm 91 bpm 93 bpm  ? Oxygen Saturation (Admit) 97 % -- -- 98 % 98 %  ? Oxygen Saturation (Exercise) 96 % -- -- 93 % 97 %  ? Oxygen Saturation (Exit) -- -- -- 97 % 96 %  ? Rating of Perceived Exertion (Exercise) '11 13 15 12 12  '$ ? Perceived Dyspnea (Exercise) 1 -- -- -- --  ? Symptoms None -- -- none none  ? Comments walk test results 3rd full day of exercise -- -- --  ? Duration -- Continue with 30 min of aerobic exercise without signs/symptoms of physical distress. Continue with 30 min of aerobic exercise without signs/symptoms of physical distress. Continue with 30 min of aerobic exercise without signs/symptoms of physical distress. Continue with 30 min of aerobic exercise without signs/symptoms of physical distress.  ? Intensity -- THRR unchanged THRR unchanged THRR unchanged THRR unchanged  ?  ? Progression  ? Progression -- Continue to progress workloads to maintain intensity without signs/symptoms of physical distress. Continue to progress  workloads to maintain intensity without signs/symptoms of physical distress. Continue to progress workloads to maintain intensity without signs/symptoms of physical distress. Continue to progress workloads to maintain

## 2022-03-11 ENCOUNTER — Other Ambulatory Visit: Payer: Self-pay

## 2022-03-11 VITALS — Ht 68.8 in | Wt 182.0 lb

## 2022-03-11 DIAGNOSIS — Z9889 Other specified postprocedural states: Secondary | ICD-10-CM

## 2022-03-11 NOTE — Patient Instructions (Signed)
Discharge Patient Instructions ? ?Patient Details  ?Name: Melanie Simmons ?MRN: [id number] ?Date of Birth: [birth year] ?Referring Provider:  Corey Skains, MD ? ? ?Number of Visits: 22 ? ?Reason for Discharge:  ?Patient reached a stable level of exercise. ?Patient independent in their exercise. ?Patient has met program and personal goals. ? ?Smoking History:  ?Social History  ? ?Tobacco Use  ?Smoking Status Former  ?Smokeless Tobacco Never  ? ? ?Diagnosis:  ?S/P mitral valve repair ? ?Initial Exercise Prescription: ? Initial Exercise Prescription - 01/13/22 1600   ? ?  ? Date of Initial Exercise RX and Referring Provider  ? Date 01/13/22   ? Referring Provider Serafina Royals MD   ?  ? Oxygen  ? Maintain Oxygen Saturation 88% or higher   ?  ? Treadmill  ? MPH 1.8   ? Grade 0.5   ? Minutes 15   ? METs 2.5   ?  ? Recumbant Bike  ? Level 1   ? RPM 60   ? Minutes 15   ? METs 1.9   ?  ? NuStep  ? Level 1   ? SPM 80   ? Minutes 15   ? METs 1.9   ?  ? Prescription Details  ? Frequency (times per week) 3   ? Duration Progress to 30 minutes of continuous aerobic without signs/symptoms of physical distress   ?  ? Intensity  ? THRR 40-80% of Max Heartrate 110 - 130   ? Ratings of Perceived Exertion 11-13   ? Perceived Dyspnea 0-4   ?  ? Progression  ? Progression Continue to progress workloads to maintain intensity without signs/symptoms of physical distress.   ?  ? Resistance Training  ? Training Prescription Yes   ? Weight 3 lb   ? Reps 10-15   ? ?  ?  ? ?  ? ? ?Discharge Exercise Prescription (Final Exercise Prescription Changes): ? Exercise Prescription Changes - 03/08/22 0800   ? ?  ? Response to Exercise  ? Blood Pressure (Admit) 112/58   ? Blood Pressure (Exit) 102/60   ? Heart Rate (Admit) 90 bpm   ? Heart Rate (Exercise) 103 bpm   ? Heart Rate (Exit) 93 bpm   ? Oxygen Saturation (Admit) 98 %   ? Oxygen Saturation (Exercise) 97 %   ? Oxygen Saturation (Exit) 96 %   ? Rating of Perceived Exertion (Exercise) 12   ?  Symptoms none   ? Duration Continue with 30 min of aerobic exercise without signs/symptoms of physical distress.   ? Intensity THRR unchanged   ?  ? Progression  ? Progression Continue to progress workloads to maintain intensity without signs/symptoms of physical distress.   ? Average METs 2.87   ?  ? Resistance Training  ? Training Prescription Yes   ? Weight 4 lb   ? Reps 10-15   ?  ? Interval Training  ? Interval Training No   ?  ? Treadmill  ? MPH 2.7   ? Grade 1   ? Minutes 15   ? METs 3.44   ?  ? REL-XR  ? Level 5   ? Minutes 15   ? METs 2.3   ?  ? Home Exercise Plan  ? Plans to continue exercise at Home (comment)   walking  ? Frequency Add 2 additional days to program exercise sessions.   ? Initial Home Exercises Provided 01/26/22   ? ?  ?  ? ?  ? ? ?  Functional Capacity: ? 6 Minute Walk   ? ? [place] Name 01/13/22 1622 03/11/22 1126  ?  ?  ? 6 Minute Walk  ? Phase Initial Discharge   ? Distance 985 feet 1415 feet   ? Distance % Change -- 43.65 %   ? Distance Feet Change -- 430 ft   ? Walk Time 6 minutes 6 minutes   ? # of Rest Breaks 0 0   ? MPH 1.86 2.67   ? METS 1.93 2.53   ? RPE 11 10   ? Perceived Dyspnea  1 0   ? VO2 Peak 6.78 8.85   ? Symptoms No No   ? Resting HR 90 bpm 78 bpm   ? Resting BP 112/64 110/64   ? Resting Oxygen Saturation  97 % 98 %   ? Exercise Oxygen Saturation  during 6 min walk 96 % 97 %   ? Max Ex. HR 104 bpm 94 bpm   ? Max Ex. BP 132/64 120/58   ? 2 Minute Post BP 122/66 --   ? ?  ?  ? ?  ? ? ? ? ?Nutrition & Weight - Outcomes: ? Pre Biometrics - 01/13/22 1546   ? ?  ? Pre Biometrics  ? Height 5' 8.8" (1.748 m)   ? Weight 184 lb 14.4 oz (83.9 kg)   ? BMI (Calculated) 27.45   ? Single Leg Stand 2 seconds   ? ?  ?  ? ?  ? ? Post Biometrics - 03/11/22 1127   ? ?  ?  Post  Biometrics  ? Height 5' 8.8" (1.748 m)   ? Weight 182 lb (82.6 kg)   ? BMI (Calculated) 27.02   ? Single Leg Stand 26.13 seconds   ? ?  ?  ? ?  ? ? ?Nutrition: ? Nutrition Therapy & Goals - 02/16/22 0925   ? ?  ? Personal  Nutrition Goals  ? Nutrition Goal [place] has scheduled an initial consultation with this RD 03/02/22.   ? ?  ?  ? ?  ? ? ? ?Goals reviewed with patient; copy given to patient. ?

## 2022-03-11 NOTE — Progress Notes (Signed)
Daily Session Note ? ?Patient Details  ?Name: Melanie Simmons ?MRN: [id number] ?Date of Birth: 07-02-1941 ?Referring Provider:   ?Flowsheet Row Cardiac Rehab from 01/13/2022 in [facility] Cardiac and Pulmonary Rehab  ?Referring Provider Serafina Royals MD  ? ?  ? ? ?Encounter Date: 03/11/2022 ? ?Check In: ? Session Check In - 03/11/22 0918   ? ?  ? Check-In  ? Supervising physician immediately available to respond to emergencies See telemetry face sheet for immediately available ER MD   ? Location [facility]-Cardiac & Pulmonary Rehab   ? Staff Present Birdie Sons, MPA, Nino Glow, MS, ASCM CEP, Exercise Physiologist;Navjot Loera Amedeo Plenty, BS, ACSM CEP, Exercise Physiologist   ? Virtual Visit No   ? Medication changes reported     No   ? Fall or balance concerns reported    No   ? Warm-up and Cool-down Performed on first and last piece of equipment   ? Resistance Training Performed Yes   ? VAD Patient? No   ? PAD/SET Patient? No   ?  ? Pain Assessment  ? Currently in Pain? No/denies   ? ?  ?  ? ?  ? ? ? ? ? ?Social History  ? ?Tobacco Use  ?Smoking Status Former  ?Smokeless Tobacco Never  ? ? ?Goals Met:  ?Independence with exercise equipment ?Exercise tolerated well ?No report of concerns or symptoms today ?Strength training completed today ? ?Goals Unmet:  ?Not Applicable ? ?Comments: Pt able to follow exercise prescription today without complaint.  Will continue to monitor for progression. ? ? ? ?Dr. Emily Filbert is Medical Director for [place].  ?Dr. Ottie Glazier is Medical Director for [facility] Pulmonary Rehabilitation. ?

## 2022-03-16 ENCOUNTER — Other Ambulatory Visit: Payer: Self-pay

## 2022-03-16 DIAGNOSIS — Z9889 Other specified postprocedural states: Secondary | ICD-10-CM

## 2022-03-16 NOTE — Progress Notes (Signed)
Daily Session Note ? ?Patient Details  ?Name: Melanie Simmons ?MRN: [id number] ?Date of Birth: 30-Dec-1940 ?Referring Provider:   ?Flowsheet Row Cardiac Rehab from 01/13/2022 in [facility] Cardiac and Pulmonary Rehab  ?Referring Provider Serafina Royals MD  ? ?  ? ? ?Encounter Date: 03/16/2022 ? ?Check In: ? Session Check In - 03/16/22 0914   ? ?  ? Check-In  ? Supervising physician immediately available to respond to emergencies See telemetry face sheet for immediately available ER MD   ? Location [facility]-Cardiac & Pulmonary Rehab   ? Staff Present Birdie Sons, MPA, RN;Amanda Sommer, BA, ACSM CEP, Exercise Physiologist;Jessica Luan Pulling, MA, RCEP, CCRP, CCET   ? Virtual Visit No   ? Medication changes reported     No   ? Fall or balance concerns reported    No   ? Warm-up and Cool-down Performed on first and last piece of equipment   ? Resistance Training Performed Yes   ? VAD Patient? No   ? PAD/SET Patient? No   ?  ? Pain Assessment  ? Currently in Pain? No/denies   ? ?  ?  ? ?  ? ? ? ? ? ?Social History  ? ?Tobacco Use  ?Smoking Status Former  ?Smokeless Tobacco Never  ? ? ?Goals Met:  ?Independence with exercise equipment ?Exercise tolerated well ?No report of concerns or symptoms today ?Strength training completed today ? ?Goals Unmet:  ?Not Applicable ? ?Comments: Pt able to follow exercise prescription today without complaint.  Will continue to monitor for progression. ? ? ? ?Dr. Emily Filbert is Medical Director for [place].  ?Dr. Ottie Glazier is Medical Director for [facility] Pulmonary Rehabilitation. ?

## 2022-03-16 NOTE — Progress Notes (Signed)
Completed initial RD consultation ?

## 2022-03-18 DIAGNOSIS — Z9889 Other specified postprocedural states: Secondary | ICD-10-CM

## 2022-03-18 NOTE — Progress Notes (Signed)
Daily Session Note ? ?Patient Details  ?Name: Melanie Simmons ?MRN: [id number] ?Date of Birth: Oct 26, 1941 ?Referring Provider:   ?Flowsheet Row Cardiac Rehab from 01/13/2022 in [facility] Cardiac and Pulmonary Rehab  ?Referring Provider Serafina Royals MD  ? ?  ? ? ?Encounter Date: 03/18/2022 ? ?Check In: ? Session Check In - 03/18/22 0925   ? ?  ? Check-In  ? Supervising physician immediately available to respond to emergencies See telemetry face sheet for immediately available ER MD   ? Location [facility]-Cardiac & Pulmonary Rehab   ? Staff Present Birdie Sons, MPA, RN;Melissa [place], RDN, Rowe Pavy, BA, ACSM CEP, Exercise Physiologist   ? Virtual Visit No   ? Medication changes reported     No   ? Fall or balance concerns reported    No   ? Warm-up and Cool-down Performed on first and last piece of equipment   ? Resistance Training Performed Yes   ? VAD Patient? No   ? PAD/SET Patient? No   ?  ? Pain Assessment  ? Currently in Pain? No/denies   ? ?  ?  ? ?  ? ? ? ? ? ?Social History  ? ?Tobacco Use  ?Smoking Status Former  ?Smokeless Tobacco Never  ? ? ?Goals Met:  ?Independence with exercise equipment ?Exercise tolerated well ?No report of concerns or symptoms today ?Strength training completed today ? ?Goals Unmet:  ?Not Applicable ? ?Comments: Pt able to follow exercise prescription today without complaint.  Will continue to monitor for progression. ? ? ? ?Dr. Emily Filbert is Medical Director for [place].  ?Dr. Ottie Glazier is Medical Director for [facility] Pulmonary Rehabilitation. ?

## 2022-03-23 ENCOUNTER — Encounter: Payer: Medicare Other | Attending: Internal Medicine

## 2022-03-23 DIAGNOSIS — Z952 Presence of prosthetic heart valve: Secondary | ICD-10-CM | POA: Diagnosis not present

## 2022-03-23 DIAGNOSIS — Z9889 Other specified postprocedural states: Secondary | ICD-10-CM | POA: Insufficient documentation

## 2022-03-23 NOTE — Progress Notes (Signed)
Daily Session Note ? ?Patient Details  ?Name: Melanie Simmons ?MRN: [id number] ?Date of Birth: May 08, 1941 ?Referring Provider:   ?Flowsheet Row Cardiac Rehab from 01/13/2022 in [facility] Cardiac and Pulmonary Rehab  ?Referring Provider Serafina Royals MD  ? ?  ? ? ?Encounter Date: 03/23/2022 ? ?Check In: ? Session Check In - 03/23/22 0913   ? ?  ? Check-In  ? Supervising physician immediately available to respond to emergencies See telemetry face sheet for immediately available ER MD   ? Location [facility]-Cardiac & Pulmonary Rehab   ? Staff Present Birdie Sons, MPA, RN;Jessica [place], MA, RCEP, CCRP, CCET;Amanda Sommer, BA, ACSM CEP, Exercise Physiologist   ? Virtual Visit No   ? Medication changes reported     No   ? Fall or balance concerns reported    No   ? Warm-up and Cool-down Performed on first and last piece of equipment   ? Resistance Training Performed Yes   ? VAD Patient? No   ? PAD/SET Patient? No   ?  ? Pain Assessment  ? Currently in Pain? No/denies   ? ?  ?  ? ?  ? ? ? ? ? ?Social History  ? ?Tobacco Use  ?Smoking Status Former  ?Smokeless Tobacco Never  ? ? ?Goals Met:  ?Independence with exercise equipment ?Exercise tolerated well ?No report of concerns or symptoms today ?Strength training completed today ? ?Goals Unmet:  ?Not Applicable ? ?Comments: Pt able to follow exercise prescription today without complaint.  Will continue to monitor for progression. ? ? ? ?Dr. Emily Filbert is Medical Director for [place].  ?Dr. Ottie Glazier is Medical Director for [facility] Pulmonary Rehabilitation. ?

## 2022-03-25 DIAGNOSIS — Z952 Presence of prosthetic heart valve: Secondary | ICD-10-CM | POA: Diagnosis not present

## 2022-03-25 DIAGNOSIS — Z9889 Other specified postprocedural states: Secondary | ICD-10-CM

## 2022-03-25 NOTE — Progress Notes (Signed)
Daily Session Note ? ?Patient Details  ?Name: Melanie Simmons ?MRN: [id number] ?Date of Birth: 21-Dec-1940 ?Referring Provider:   ?Flowsheet Row Cardiac Rehab from 01/13/2022 in [facility] Cardiac and Pulmonary Rehab  ?Referring Provider Serafina Royals MD  ? ?  ? ? ?Encounter Date: 03/25/2022 ? ?Check In: ? Session Check In - 03/25/22 0923   ? ?  ? Check-In  ? Supervising physician immediately available to respond to emergencies See telemetry face sheet for immediately available ER MD   ? Location [facility]-Cardiac & Pulmonary Rehab   ? Staff Present Birdie Sons, MPA, RN;Amanda Sommer, BA, ACSM CEP, Exercise Physiologist;Xaviar Lunn Amedeo Plenty, BS, ACSM CEP, Exercise Physiologist;Jessica [place], MA, RCEP, CCRP, CCET   ? Virtual Visit No   ? Medication changes reported     No   ? Fall or balance concerns reported    No   ? Warm-up and Cool-down Performed on first and last piece of equipment   ? Resistance Training Performed Yes   ? VAD Patient? No   ? PAD/SET Patient? No   ?  ? Pain Assessment  ? Currently in Pain? No/denies   ? ?  ?  ? ?  ? ? ? ? ? ?Social History  ? ?Tobacco Use  ?Smoking Status Former  ?Smokeless Tobacco Never  ? ? ?Goals Met:  ?Independence with exercise equipment ?Exercise tolerated well ?No report of concerns or symptoms today ?Strength training completed today ? ?Goals Unmet:  ?Not Applicable ? ?Comments: Pt able to follow exercise prescription today without complaint.  Will continue to monitor for progression. ? ? ? ?Dr. Emily Filbert is Medical Director for [place].  ?Dr. Ottie Glazier is Medical Director for [facility] Pulmonary Rehabilitation. ?

## 2022-03-30 DIAGNOSIS — Z9889 Other specified postprocedural states: Secondary | ICD-10-CM

## 2022-03-30 DIAGNOSIS — Z952 Presence of prosthetic heart valve: Secondary | ICD-10-CM | POA: Diagnosis not present

## 2022-03-30 NOTE — Progress Notes (Signed)
Daily Session Note ? ?Patient Details  ?Name: Melanie Simmons ?MRN: [id number] ?Date of Birth: 07-12-1941 ?Referring Provider:   ?Flowsheet Row Cardiac Rehab from 01/13/2022 in [facility] Cardiac and Pulmonary Rehab  ?Referring Provider Serafina Royals MD  ? ?  ? ? ?Encounter Date: 03/30/2022 ? ?Check In: ? Session Check In - 03/30/22 0932   ? ?  ? Check-In  ? Supervising physician immediately available to respond to emergencies See telemetry face sheet for immediately available ER MD   ? Location [facility]-Cardiac & Pulmonary Rehab   ? Staff Present Birdie Sons, MPA, RN;Melissa [place], RDN, Rowe Pavy, BA, ACSM CEP, Exercise Physiologist   ? Virtual Visit No   ? Medication changes reported     No   ? Fall or balance concerns reported    No   ? Warm-up and Cool-down Performed on first and last piece of equipment   ? Resistance Training Performed Yes   ? VAD Patient? No   ? PAD/SET Patient? No   ?  ? Pain Assessment  ? Currently in Pain? No/denies   ? ?  ?  ? ?  ? ? ? ? ? ?Social History  ? ?Tobacco Use  ?Smoking Status Former  ?Smokeless Tobacco Never  ? ? ?Goals Met:  ?Independence with exercise equipment ?Exercise tolerated well ?No report of concerns or symptoms today ?Strength training completed today ? ?Goals Unmet:  ?Not Applicable ? ?Comments: Pt able to follow exercise prescription today without complaint.  Will continue to monitor for progression. ? ? ? ?Dr. Emily Filbert is Medical Director for [place].  ?Dr. Ottie Glazier is Medical Director for [facility] Pulmonary Rehabilitation. ?

## 2022-04-02 ENCOUNTER — Encounter: Payer: Medicare Other | Admitting: *Deleted

## 2022-04-02 DIAGNOSIS — Z9889 Other specified postprocedural states: Secondary | ICD-10-CM

## 2022-04-02 DIAGNOSIS — Z952 Presence of prosthetic heart valve: Secondary | ICD-10-CM | POA: Diagnosis not present

## 2022-04-02 NOTE — Progress Notes (Signed)
Daily Session Note ? ?Patient Details  ?Name: Melanie Simmons ?MRN: [id number] ?Date of Birth: [birth year] ?Referring Provider:   ?Flowsheet Row Cardiac Rehab from 01/13/2022 in [facility] Cardiac and Pulmonary Rehab  ?Referring Provider Serafina Royals MD  ? ?  ? ? ?Encounter Date: 04/02/2022 ? ?Check In: ? Session Check In - 04/02/22 1022   ? ?  ? Check-In  ? Supervising physician immediately available to respond to emergencies See telemetry face sheet for immediately available ER MD   ? Location [facility]-Cardiac & Pulmonary Rehab   ? Staff Present Renita Papa, RN BSN;Joseph Snyder, RCP,RRT,BSRT;Melissa Iredell, [place], LDN   ? Virtual Visit No   ? Medication changes reported     No   ? Fall or balance concerns reported    No   ? Warm-up and Cool-down Performed on first and last piece of equipment   ? Resistance Training Performed Yes   ? VAD Patient? No   ? PAD/SET Patient? No   ?  ? Pain Assessment  ? Currently in Pain? No/denies   ? ?  ?  ? ?  ? ? ? ? ? ?Social History  ? ?Tobacco Use  ?Smoking Status Former  ?Smokeless Tobacco Never  ? ? ?Goals Met:  ?Independence with exercise equipment ?Exercise tolerated well ?No report of concerns or symptoms today ?Strength training completed today ? ?Goals Unmet:  ?Not Applicable ? ?Comments: Pt able to follow exercise prescription today without complaint.  Will continue to monitor for progression. ? ? ? ?Dr. Emily Filbert is Medical Director for [place].  ?Dr. Ottie Glazier is Medical Director for [facility] Pulmonary Rehabilitation. ?

## 2022-04-06 DIAGNOSIS — Z952 Presence of prosthetic heart valve: Secondary | ICD-10-CM | POA: Diagnosis not present

## 2022-04-06 DIAGNOSIS — Z9889 Other specified postprocedural states: Secondary | ICD-10-CM

## 2022-04-06 NOTE — Progress Notes (Signed)
Cardiac Individual Treatment Plan ? ?Patient Details  ?Name: Melanie Simmons ?MRN: [id number] ?Date of Birth: August 13, 1941 ?Referring Provider:   ?Flowsheet Row Cardiac Rehab from 01/13/2022 in [facility] Cardiac and Pulmonary Rehab  ?Referring Provider Serafina Royals MD  ? ?  ? ? ?Initial Encounter Date:  ?Flowsheet Row Cardiac Rehab from 01/13/2022 in [facility] Cardiac and Pulmonary Rehab  ?Date 01/13/22  ? ?  ? ? ?Visit Diagnosis: S/P mitral valve repair ? ?Patient's Home Medications on Admission: ? ?Current Outpatient Medications:  ?  aspirin 81 MG chewable tablet, Chew by mouth., Disp: , Rfl:  ?  Biotin w/ Vitamins C & E (HAIR/SKIN/NAILS PO), Take 1 tablet by mouth in the morning., Disp: , Rfl:  ?  Calcium Carb-Cholecalciferol (CALCIUM 500+D3 PO), Take 1 tablet by mouth in the morning., Disp: , Rfl:  ?  Cholecalciferol (VITAMIN D3 PO), Take 1 tablet by mouth in the morning., Disp: , Rfl:  ?  Multiple Minerals-Vitamins (CAL-MAG-ZINC-D PO), Take 1 tablet by mouth in the morning. (Patient not taking: Reported on 11/18/2021), Disp: , Rfl:  ?  triamterene-hydrochlorothiazide (DYAZIDE) 37.5-25 MG capsule, Take 1 capsule by mouth in the morning., Disp: , Rfl:  ? ?Past Medical History: ?Past Medical History:  ?Diagnosis Date  ? Breast cancer ([place])   ? Hyperlipidemia   ? Hypertension   ? Motion sickness   ? ships  ? PONV (postoperative nausea and vomiting)   ? Vertigo   ? ? ?Tobacco Use: ?Social History  ? ?Tobacco Use  ?Smoking Status Former  ?Smokeless Tobacco Never  ? ? ?Labs: ?Review Flowsheet   ? ?    ? View : No data to display.  ?  ?  ?  ?  ?  ? ? ? ?Exercise Target Goals: ?Exercise Program Goal: ?Individual exercise prescription set using results from initial 6 min walk test and THRR while considering  patient?s activity barriers and safety.  ? ?Exercise Prescription Goal: ?Initial exercise prescription builds to 30-45 minutes a day of aerobic activity, 2-3 days per week.  Home exercise guidelines will be given to patient  during program as part of exercise prescription that the participant will acknowledge. ? ? ?Education: Aerobic Exercise: ?- Group verbal and visual presentation on the components of exercise prescription. Introduces F.I.T.T principle from ACSM for exercise prescriptions.  Reviews F.I.T.T. principles of aerobic exercise including progression. Written material given at graduation. ?Flowsheet Row Cardiac Rehab from 03/25/2022 in [facility] Cardiac and Pulmonary Rehab  ?Date 02/18/22  ?Educator AS  ?Instruction Review Code 1- Verbalizes Understanding  ? ?  ? ? ?Education: Resistance Exercise: ?- Group verbal and visual presentation on the components of exercise prescription. Introduces F.I.T.T principle from ACSM for exercise prescriptions  Reviews F.I.T.T. principles of resistance exercise including progression. Written material given at graduation. ?Flowsheet Row Cardiac Rehab from 03/25/2022 in [facility] Cardiac and Pulmonary Rehab  ?Date 02/25/22  ?Educator AS  ?Instruction Review Code 1- Verbalizes Understanding  ? ?  ? ?  ?Education: Exercise & Equipment Safety: ?- Individual verbal instruction and demonstration of equipment use and safety with use of the equipment. ?Flowsheet Row Cardiac Rehab from 03/25/2022 in [facility] Cardiac and Pulmonary Rehab  ?Education need identified 01/13/22  ?Date 01/13/22  ?Educator KL  ?Instruction Review Code 1- Verbalizes Understanding  ? ?  ? ? ?Education: Exercise Physiology & General Exercise Guidelines: ?- Group verbal and written instruction with models to review the exercise physiology of the cardiovascular system and associated critical values. Provides general exercise guidelines  with specific guidelines to those with heart or lung disease.  ?Flowsheet Row Cardiac Rehab from 03/25/2022 in [facility] Cardiac and Pulmonary Rehab  ?Education need identified 01/13/22  ? ?  ? ? ?Education: Flexibility, Balance, Mind/Body Relaxation: ?- Group verbal and visual presentation with interactive activity on the  components of exercise prescription. Introduces F.I.T.T principle from ACSM for exercise prescriptions. Reviews F.I.T.T. principles of flexibility and balance exercise training including progression. Also discusses the mind body connection.  Reviews various relaxation techniques to help reduce and manage stress (i.e. Deep breathing, progressive muscle relaxation, and visualization). Balance handout provided to take home. Written material given at graduation. ?Flowsheet Row Cardiac Rehab from 03/25/2022 in [facility] Cardiac and Pulmonary Rehab  ?Date 03/11/22  ?Educator AS  ?Instruction Review Code 1- Verbalizes Understanding  ? ?  ? ? ?Activity Barriers & Risk Stratification: ? Activity Barriers & Cardiac Risk Stratification - 01/13/22 1546   ? ?  ? Activity Barriers & Cardiac Risk Stratification  ? Activity Barriers Shortness of Breath;Deconditioning   ? Cardiac Risk Stratification Low   ? ?  ?  ? ?  ? ? ?6 Minute Walk: ? 6 Minute Walk   ? ? [place] Name 01/13/22 1622 03/11/22 1126  ?  ?  ? 6 Minute Walk  ? Phase Initial Discharge   ? Distance 985 feet 1415 feet   ? Distance % Change -- 43.65 %   ? Distance Feet Change -- 430 ft   ? Walk Time 6 minutes 6 minutes   ? # of Rest Breaks 0 0   ? MPH 1.86 2.67   ? METS 1.93 2.53   ? RPE 11 10   ? Perceived Dyspnea  1 0   ? VO2 Peak 6.78 8.85   ? Symptoms No No   ? Resting HR 90 bpm 78 bpm   ? Resting BP 112/64 110/64   ? Resting Oxygen Saturation  97 % 98 %   ? Exercise Oxygen Saturation  during 6 min walk 96 % 97 %   ? Max Ex. HR 104 bpm 94 bpm   ? Max Ex. BP 132/64 120/58   ? 2 Minute Post BP 122/66 --   ? ?  ?  ? ?  ? ? ?Oxygen Initial Assessment: ? ? ?Oxygen Re-Evaluation: ? ? ?Oxygen Discharge (Final Oxygen Re-Evaluation): ? ? ?Initial Exercise Prescription: ? Initial Exercise Prescription - 01/13/22 1600   ? ?  ? Date of Initial Exercise RX and Referring Provider  ? Date 01/13/22   ? Referring Provider Serafina Royals MD   ?  ? Oxygen  ? Maintain Oxygen Saturation 88% or  higher   ?  ? Treadmill  ? MPH 1.8   ? Grade 0.5   ? Minutes 15   ? METs 2.5   ?  ? Recumbant Bike  ? Level 1   ? RPM 60   ? Minutes 15   ? METs 1.9   ?  ? NuStep  ? Level 1   ? SPM 80   ? Minutes 15   ? METs 1.9   ?  ? Prescription Details  ? Frequency (times per week) 3   ? Duration Progress to 30 minutes of continuous aerobic without signs/symptoms of physical distress   ?  ? Intensity  ? THRR 40-80% of Max Heartrate 110 - 130   ? Ratings of Perceived Exertion 11-13   ? Perceived Dyspnea 0-4   ?  ? Progression  ?  Progression Continue to progress workloads to maintain intensity without signs/symptoms of physical distress.   ?  ? Resistance Training  ? Training Prescription Yes   ? Weight 3 lb   ? Reps 10-15   ? ?  ?  ? ?  ? ? ?Perform Capillary Blood Glucose checks as needed. ? ?Exercise Prescription Changes: ? ? Exercise Prescription Changes   ? ? [place] Name 01/13/22 1600 01/25/22 1300 02/08/22 1400 02/22/22 0800 03/08/22 0800  ?  ? Response to Exercise  ? Blood Pressure (Admit) 112/64 122/64 124/72 146/74 112/58  ? Blood Pressure (Exercise) 132/64 126/70 132/58 -- --  ? Blood Pressure (Exit) 122/66 108/62 120/64 124/72 102/60  ? Heart Rate (Admit) 90 bpm 96 bpm 90 bpm 87 bpm 90 bpm  ? Heart Rate (Exercise) 104 bpm 103 bpm 119 bpm 116 bpm 103 bpm  ? Heart Rate (Exit) 94 bpm 97 bpm 99 bpm 91 bpm 93 bpm  ? Oxygen Saturation (Admit) 97 % -- -- 98 % 98 %  ? Oxygen Saturation (Exercise) 96 % -- -- 93 % 97 %  ? Oxygen Saturation (Exit) -- -- -- 97 % 96 %  ? Rating of Perceived Exertion (Exercise) '11 13 15 12 12  '$ ? Perceived Dyspnea (Exercise) 1 -- -- -- --  ? Symptoms None -- -- none none  ? Comments walk test results 3rd full day of exercise -- -- --  ? Duration -- Continue with 30 min of aerobic exercise without signs/symptoms of physical distress. Continue with 30 min of aerobic exercise without signs/symptoms of physical distress. Continue with 30 min of aerobic exercise without signs/symptoms of physical distress.  Continue with 30 min of aerobic exercise without signs/symptoms of physical distress.  ? Intensity -- THRR unchanged THRR unchanged THRR unchanged THRR unchanged  ?  ? Progression  ? Progression -- Continue to p

## 2022-04-06 NOTE — Patient Instructions (Signed)
Discharge Patient Instructions ? ?Patient Details  ?Name: Melanie Simmons ?MRN: [id number] ?Date of Birth: [birth year] ?Referring Provider:  Corey Skains, MD ? ? ?Number of Visits: 28 ? ?Reason for Discharge:  ?Patient reached a stable level of exercise. ?Patient independent in their exercise. ?Patient has met program and personal goals. ? ?Smoking History:  ?Social History  ? ?Tobacco Use  ?Smoking Status Former  ?Smokeless Tobacco Never  ? ? ?Diagnosis:  ?S/P mitral valve repair ? ?Initial Exercise Prescription: ? Initial Exercise Prescription - 01/13/22 1600   ? ?  ? Date of Initial Exercise RX and Referring Provider  ? Date 01/13/22   ? Referring Provider Serafina Royals MD   ?  ? Oxygen  ? Maintain Oxygen Saturation 88% or higher   ?  ? Treadmill  ? MPH 1.8   ? Grade 0.5   ? Minutes 15   ? METs 2.5   ?  ? Recumbant Bike  ? Level 1   ? RPM 60   ? Minutes 15   ? METs 1.9   ?  ? NuStep  ? Level 1   ? SPM 80   ? Minutes 15   ? METs 1.9   ?  ? Prescription Details  ? Frequency (times per week) 3   ? Duration Progress to 30 minutes of continuous aerobic without signs/symptoms of physical distress   ?  ? Intensity  ? THRR 40-80% of Max Heartrate 110 - 130   ? Ratings of Perceived Exertion 11-13   ? Perceived Dyspnea 0-4   ?  ? Progression  ? Progression Continue to progress workloads to maintain intensity without signs/symptoms of physical distress.   ?  ? Resistance Training  ? Training Prescription Yes   ? Weight 3 lb   ? Reps 10-15   ? ?  ?  ? ?  ? ? ?Discharge Exercise Prescription (Final Exercise Prescription Changes): ? Exercise Prescription Changes - 04/05/22 1400   ? ?  ? Response to Exercise  ? Blood Pressure (Admit) 118/68   ? Blood Pressure (Exit) 118/60   ? Heart Rate (Admit) 77 bpm   ? Heart Rate (Exercise) 91 bpm   ? Heart Rate (Exit) 78 bpm   ? Rating of Perceived Exertion (Exercise) 13   ? Symptoms none   ? Duration Continue with 30 min of aerobic exercise without signs/symptoms of physical  distress.   ? Intensity THRR unchanged   ?  ? Progression  ? Progression Continue to progress workloads to maintain intensity without signs/symptoms of physical distress.   ? Average METs 3.3   ?  ? Resistance Training  ? Training Prescription Yes   ? Weight 5 lb   ? Reps 10-15   ?  ? Interval Training  ? Interval Training No   ?  ? Treadmill  ? MPH 3   ? Grade 0   ? Minutes 15   ? METs 3.3   ?  ? T5 Nustep  ? Level 1   ? Minutes 15   ? METs 2.4   ? ?  ?  ? ?  ? ? ?Functional Capacity: ? 6 Minute Walk   ? ? [place] Name 01/13/22 1622 03/11/22 1126  ?  ?  ? 6 Minute Walk  ? Phase Initial Discharge   ? Distance 985 feet 1415 feet   ? Distance % Change -- 43.65 %   ? Distance Feet Change -- 430 ft   ?  Walk Time 6 minutes 6 minutes   ? # of Rest Breaks 0 0   ? MPH 1.86 2.67   ? METS 1.93 2.53   ? RPE 11 10   ? Perceived Dyspnea  1 0   ? VO2 Peak 6.78 8.85   ? Symptoms No No   ? Resting HR 90 bpm 78 bpm   ? Resting BP 112/64 110/64   ? Resting Oxygen Saturation  97 % 98 %   ? Exercise Oxygen Saturation  during 6 min walk 96 % 97 %   ? Max Ex. HR 104 bpm 94 bpm   ? Max Ex. BP 132/64 120/58   ? 2 Minute Post BP 122/66 --   ? ?  ?  ? ?  ? ? ?Quality of Life: ? Quality of Life - 03/16/22 1030   ? ?  ? Quality of Life Scores  ? Health/Function Pre 23.57 %   ? Health/Function Post 28.8 %   ? Health/Function % Change 22.19 %   ? Socioeconomic Pre 30 %   ? Socioeconomic Post 29.25 %   ? Socioeconomic % Change  -2.5 %   ? Psych/Spiritual Pre 30 %   ? Psych/Spiritual Post 29.14 %   ? Psych/Spiritual % Change -2.87 %   ? Family Pre 30 %   ? Family Post 30 %   ? Family % Change 0 %   ? GLOBAL Pre 27.1 %   ? GLOBAL Post 29.14 %   ? GLOBAL % Change 7.53 %   ? ?  ?  ? ?  ? ? ?  ?Nutrition & Weight - Outcomes: ? Pre Biometrics - 01/13/22 1546   ? ?  ? Pre Biometrics  ? Height 5' 8.8" (1.748 m)   ? Weight 184 lb 14.4 oz (83.9 kg)   ? BMI (Calculated) 27.45   ? Single Leg Stand 2 seconds   ? ?  ?  ? ?  ? ? Post Biometrics - 03/11/22 1127    ? ?  ?  Post  Biometrics  ? Height 5' 8.8" (1.748 m)   ? Weight 182 lb (82.6 kg)   ? BMI (Calculated) 27.02   ? Single Leg Stand 26.13 seconds   ? ?  ?  ? ?  ? ? ?Nutrition: ? Nutrition Therapy & Goals - 03/16/22 1021   ? ?  ? Nutrition Therapy  ? Diet Heart healthy, low Na   ? Protein (specify units) 65g   ? Fiber 25 grams   ? Whole Grain Foods 3 servings   ? Saturated Fats 16 max. grams   ? Fruits and Vegetables 8 servings/day   ? Sodium 2 grams   ?  ? Personal Nutrition Goals  ? Nutrition Goal ST: practice ingredient prepping and pre-making some meals LT: maintain changes made   ? Comments 81 y.o. F admitted to cardiac rehab s/p mitral valve repair. PMHx includes HTN, HLD, breast cancer (2017). Relevant medications includes biotin w/ vit C, calcium, vit D, MVI. PYP Score: 71. Vegetables & Fruits 8/12. Breads, Grains & Cereals 6/12. Red & Processed Meat 12/12. Poultry 2/2. Fish & Shellfish 0/4. Beans, Nuts & Seeds 2/4. Milk & Dairy Foods 4/6. Toppings, Oils, Seasonings & Salt 16/20. Sweets, Snacks & Restaurant Food 11/14. Beverages 10/10. She doesn't like to eat breakfast, will have OJ and some fruit daily and will sometimes have a powerbar or protein bar L: carrots or celery, will sometimes go out to  lunch. S:cheese and cracker D: meat, mostly chicken, and vegetables. She reports it's hard to cook for one. Discussed tactics that may help with this such as ingredient prepping and preparing some meals in advance that can be frozen. Reviewed heart healthy eating. When she saw my nutrition education she decided to include more vegetables and a greater variety of vegetables. She reports having a lower appetite and altered taste recently - she is practicing mechanical eating right now. Suggested including some fat/protein at lunch and breakfast such as peanut butter which she enjoys. She has issues with textures of certain foods like mushrooms, yogurt, hummus.   ?  ? Intervention Plan  ? Intervention Prescribe,  educate and counsel regarding individualized specific dietary modifications aiming towards targeted core components such as weight, hypertension, lipid management, diabetes, heart failure and other comorbidities.   ? Expected Outcomes Short Term Goal: Understand basic principles of dietary content, such as calories, fat, sodium, cholesterol and nutrients.;Short Term Goal: A plan has been developed with personal nutrition goals set during dietitian appointment.;Long Term Goal: Adherence to prescribed nutrition plan.   ? ?  ?  ? ?  ? ? ? ? ?Goals reviewed with patient; copy given to patient. ?

## 2022-04-06 NOTE — Progress Notes (Signed)
Discharge Progress Report ? ?Patient Details  ?Name: Melanie Simmons ?MRN: [id number] ?Date of Birth: 03/11/1941 ?Referring Provider:   ?Flowsheet Row Cardiac Rehab from 01/13/2022 in [facility] Cardiac and Pulmonary Rehab  ?Referring Provider Serafina Royals MD  ? ?  ? ? ? ?Number of Visits: 49 ? ?Reason for Discharge:  ?Patient reached a stable level of exercise. ?Patient independent in their exercise. ?Patient has met program and personal goals. ? ?Smoking History:  ?Social History  ? ?Tobacco Use  ?Smoking Status Former  ?Smokeless Tobacco Never  ? ? ?Diagnosis:  ?S/P mitral valve repair ? ?ADL [facility]: ? ? ?Initial Exercise Prescription: ? Initial Exercise Prescription - 01/13/22 1600   ? ?  ? Date of Initial Exercise RX and Referring Provider  ? Date 01/13/22   ? Referring Provider Serafina Royals MD   ?  ? Oxygen  ? Maintain Oxygen Saturation 88% or higher   ?  ? Treadmill  ? MPH 1.8   ? Grade 0.5   ? Minutes 15   ? METs 2.5   ?  ? Recumbant Bike  ? Level 1   ? RPM 60   ? Minutes 15   ? METs 1.9   ?  ? NuStep  ? Level 1   ? SPM 80   ? Minutes 15   ? METs 1.9   ?  ? Prescription Details  ? Frequency (times per week) 3   ? Duration Progress to 30 minutes of continuous aerobic without signs/symptoms of physical distress   ?  ? Intensity  ? THRR 40-80% of Max Heartrate 110 - 130   ? Ratings of Perceived Exertion 11-13   ? Perceived Dyspnea 0-4   ?  ? Progression  ? Progression Continue to progress workloads to maintain intensity without signs/symptoms of physical distress.   ?  ? Resistance Training  ? Training Prescription Yes   ? Weight 3 lb   ? Reps 10-15   ? ?  ?  ? ?  ? ? ?Discharge Exercise Prescription (Final Exercise Prescription Changes): ? Exercise Prescription Changes - 04/05/22 1400   ? ?  ? Response to Exercise  ? Blood Pressure (Admit) 118/68   ? Blood Pressure (Exit) 118/60   ? Heart Rate (Admit) 77 bpm   ? Heart Rate (Exercise) 91 bpm   ? Heart Rate (Exit) 78 bpm   ? Rating of Perceived Exertion (Exercise)  13   ? Symptoms none   ? Duration Continue with 30 min of aerobic exercise without signs/symptoms of physical distress.   ? Intensity THRR unchanged   ?  ? Progression  ? Progression Continue to progress workloads to maintain intensity without signs/symptoms of physical distress.   ? Average METs 3.3   ?  ? Resistance Training  ? Training Prescription Yes   ? Weight 5 lb   ? Reps 10-15   ?  ? Interval Training  ? Interval Training No   ?  ? Treadmill  ? MPH 3   ? Grade 0   ? Minutes 15   ? METs 3.3   ?  ? T5 Nustep  ? Level 1   ? Minutes 15   ? METs 2.4   ? ?  ?  ? ?  ? ? ?Functional Capacity: ? 6 Minute Walk   ? ? [place] Name 01/13/22 1622 03/11/22 1126  ?  ?  ? 6 Minute Walk  ? Phase Initial Discharge   ? Distance 985  feet 1415 feet   ? Distance % Change -- 43.65 %   ? Distance Feet Change -- 430 ft   ? Walk Time 6 minutes 6 minutes   ? # of Rest Breaks 0 0   ? MPH 1.86 2.67   ? METS 1.93 2.53   ? RPE 11 10   ? Perceived Dyspnea  1 0   ? VO2 Peak 6.78 8.85   ? Symptoms No No   ? Resting HR 90 bpm 78 bpm   ? Resting BP 112/64 110/64   ? Resting Oxygen Saturation  97 % 98 %   ? Exercise Oxygen Saturation  during 6 min walk 96 % 97 %   ? Max Ex. HR 104 bpm 94 bpm   ? Max Ex. BP 132/64 120/58   ? 2 Minute Post BP 122/66 --   ? ?  ?  ? ?  ? ? ?Psychological, QOL, Others - Outcomes: ?PHQ 2/9: ? ?  03/16/2022  ? 10:32 AM 01/13/2022  ?  3:39 PM 05/27/2021  ? 11:35 AM  ?Depression screen PHQ 2/9  ?Decreased Interest 0 0 0  ?Down, Depressed, Hopeless 0 0 0  ?PHQ - 2 Score 0 0 0  ?Altered sleeping 1 0 2  ?Tired, decreased energy 1 1 0  ?Change in appetite 1 2 0  ?Feeling bad or failure about yourself  0 0 0  ?Trouble concentrating 0 0 1  ?Moving slowly or fidgety/restless 0 0 0  ?Suicidal thoughts 0 0 0  ?PHQ-9 Score _0 ?Difficult doing work/chores Not difficult at all Not difficult at all Not difficult at all  ? ? ?Quality of Life: ? Quality of Life - 03/16/22 1030   ? ?  ? Quality of Life Scores  ? Health/Function Pre 23.57 %    ? Health/Function Post 28.8 %   ? Health/Function % Change 22.19 %   ? Socioeconomic Pre 30 %   ? Socioeconomic Post 29.25 %   ? Socioeconomic % Change  -2.5 %   ? Psych/Spiritual Pre 30 %   ? Psych/Spiritual Post 29.14 %   ? Psych/Spiritual % Change -2.87 %   ? Family Pre 30 %   ? Family Post 30 %   ? Family % Change 0 %   ? GLOBAL Pre 27.1 %   ? GLOBAL Post 29.14 %   ? GLOBAL % Change 7.53 %   ? ?  ?  ? ?  ? ? ? ? ? ?Nutrition & Weight - Outcomes: ? Pre Biometrics - 01/13/22 1546   ? ?  ? Pre Biometrics  ? Height 5' 8.8" (1.748 m)   ? Weight 184 lb 14.4 oz (83.9 kg)   ? BMI (Calculated) 27.45   ? Single Leg Stand 2 seconds   ? ?  ?  ? ?  ? ? Post Biometrics - 03/11/22 1127   ? ?  ?  Post  Biometrics  ? Height 5' 8.8" (1.748 m)   ? Weight 182 lb (82.6 kg)   ? BMI (Calculated) 27.02   ? Single Leg Stand 26.13 seconds   ? ?  ?  ? ?  ? ? ?Nutrition: ? Nutrition Therapy & Goals - 03/16/22 1021   ? ?  ? Nutrition Therapy  ? Diet Heart healthy, low Na   ? Protein (specify units) 65g   ? Fiber 25 grams   ? Whole Grain Foods 3 servings   ? Saturated  Fats 16 max. grams   ? Fruits and Vegetables 8 servings/day   ? Sodium 2 grams   ?  ? Personal Nutrition Goals  ? Nutrition Goal ST: practice ingredient prepping and pre-making some meals LT: maintain changes made   ? Comments 81 y.o. F admitted to cardiac rehab s/p mitral valve repair. PMHx includes HTN, HLD, breast cancer (2017). Relevant medications includes biotin w/ vit C, calcium, vit D, MVI. PYP Score: 71. Vegetables & Fruits 8/12. Breads, Grains & Cereals 6/12. Red & Processed Meat 12/12. Poultry 2/2. Fish & Shellfish 0/4. Beans, Nuts & Seeds 2/4. Milk & Dairy Foods 4/6. Toppings, Oils, Seasonings & Salt 16/20. Sweets, Snacks & Restaurant Food 11/14. Beverages 10/10. She doesn't like to eat breakfast, will have OJ and some fruit daily and will sometimes have a powerbar or protein bar L: carrots or celery, will sometimes go out to lunch. S:cheese and cracker D: meat,  mostly chicken, and vegetables. She reports it's hard to cook for one. Discussed tactics that may help with this such as ingredient prepping and preparing some meals in advance that can be frozen. Reviewed heart healthy eating. When she saw my nutrition education she decided to include more vegetables and a greater variety of vegetables. She reports having a lower appetite and altered taste recently - she is practicing mechanical eating right now. Suggested including some fat/protein at lunch and breakfast such as peanut butter which she enjoys. She has issues with textures of certain foods like mushrooms, yogurt, hummus.   ?  ? Intervention Plan  ? Intervention Prescribe, educate and counsel regarding individualized specific dietary modifications aiming towards targeted core components such as weight, hypertension, lipid management, diabetes, heart failure and other comorbidities.   ? Expected Outcomes Short Term Goal: Understand basic principles of dietary content, such as calories, fat, sodium, cholesterol and nutrients.;Short Term Goal: A plan has been developed with personal nutrition goals set during dietitian appointment.;Long Term Goal: Adherence to prescribed nutrition plan.   ? ?  ?  ? ?  ? ? ?Nutrition Discharge: ? ? ?Education Questionnaire Score: ? Knowledge Questionnaire Score - 03/16/22 1030   ? ?  ? Knowledge Questionnaire Score  ? Pre Score 23/26: Exercise, Angina, Nutrition   ? Post Score 26/26   ? ?  ?  ? ?  ? ? ?Goals reviewed with patient; copy given to patient. ?

## 2022-04-06 NOTE — Progress Notes (Signed)
Daily Session Note ? ?Patient Details  ?Name: Melanie Simmons ?MRN: [id number] ?Date of Birth: 1941-11-09 ?Referring Provider:   ?Flowsheet Row Cardiac Rehab from 01/13/2022 in [facility] Cardiac and Pulmonary Rehab  ?Referring Provider Serafina Royals MD  ? ?  ? ? ?Encounter Date: 04/06/2022 ? ?Check In: ? Session Check In - 04/06/22 0916   ? ?  ? Check-In  ? Supervising physician immediately available to respond to emergencies See telemetry face sheet for immediately available ER MD   ? Location [facility]-Cardiac & Pulmonary Rehab   ? Staff Present Birdie Sons, MPA, RN;Amanda Sommer, BA, ACSM CEP, Exercise Physiologist;Jessica Luan Pulling, MA, RCEP, CCRP, CCET   ? Virtual Visit No   ? Medication changes reported     No   ? Fall or balance concerns reported    No   ? Warm-up and Cool-down Performed on first and last piece of equipment   ? Resistance Training Performed Yes   ? VAD Patient? No   ? PAD/SET Patient? No   ?  ? Pain Assessment  ? Currently in Pain? No/denies   ? ?  ?  ? ?  ? ? ? ? ? ?Social History  ? ?Tobacco Use  ?Smoking Status Former  ?Smokeless Tobacco Never  ? ? ?Goals Met:  ?Independence with exercise equipment ?Exercise tolerated well ?No report of concerns or symptoms today ?Strength training completed today ? ?Goals Unmet:  ?Not Applicable ? ?Comments:  Tierney graduated today from  rehab with 36 sessions completed.  Details of the patient's exercise prescription and what She needs to do in order to continue the prescription and progress were discussed with patient.  Patient was given a copy of prescription and goals.  Patient verbalized understanding.  Kirsti plans to continue to exercise by walking at home. ? ? ? ? ?Dr. Emily Filbert is Medical Director for [facility].  ?Dr. Ottie Glazier is Medical Director for [facility] Pulmonary Rehabilitation. ?

## 2022-05-13 ENCOUNTER — Ambulatory Visit
Admission: EM | Admit: 2022-05-13 | Discharge: 2022-05-13 | Disposition: A | Discharge status: Home / Self Care | Payer: Medicare Other

## 2022-05-13 DIAGNOSIS — U071 COVID-19: Secondary | ICD-10-CM

## 2022-05-13 DIAGNOSIS — R051 Acute cough: Secondary | ICD-10-CM

## 2022-05-13 MED ORDER — NIRMATRELVIR/RITONAVIR (PAXLOVID) TABLET (RENAL DOSING): 2.0000 | ORAL_TABLET | Freq: Two times a day (BID) | ORAL | 0 refills | Status: AC

## 2022-05-13 MED ORDER — NIRMATRELVIR/RITONAVIR (PAXLOVID) TABLET (RENAL DOSING)
2.0000 | ORAL_TABLET | Freq: Two times a day (BID) | ORAL | 0 refills | Status: DC
Start: 2022-05-13 — End: 2022-05-13

## 2022-05-13 MED ORDER — PREDNISONE 10 MG (21) PO TBPK: ORAL_TABLET | Freq: Every day | ORAL | 0 refills | Status: DC

## 2022-05-13 NOTE — ED Provider Notes (Signed)
[facility]    CSN: [id number] Arrival date & time: 05/13/22  1026      History   Chief Complaint Chief Complaint  Patient presents with   Cough    COVID+   Nasal Congestion    HPI Melanie Simmons is a 81 y.o. female.   Presents today with testing positive at home for COVID test.  States that she does have some fatigue slight shortness of breath.  He did receive vaccines.  Patient states she called her cardiologist since that she had a mitral valve replacement in December and they recommended patient come to get on medications.  Patient denies any fevers no chest pain.  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, estrogen receptor positive ([place]) 06/18/2016   Hyperlipidemia 04/01/2016   Episodic recurrent vertigo 04/30/2015   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    Past Surgical History:  Procedure Laterality Date   ABDOMINAL HYSTERECTOMY     BREAST BIOPSY Left 06/09/2016   left [place] core bx, [facility]   BREAST CYST ASPIRATION Left    neg   BROW LIFT Bilateral 09/06/2017   Procedure: BLEPHAROPLASTY;  Surgeon: Karle Starch, MD;  Location: [place];  Service: Ophthalmology;  Laterality: Bilateral;  MAC   MASTECTOMY Left 07/17/2016   [place], negative LN   MASTECTOMY W/ SENTINEL NODE BIOPSY Left 07/16/2016   Procedure: MASTECTOMY WITH SENTINEL LYMPH NODE BIOPSY;  Surgeon: Hubbard Robinson, MD;  Location: [facility];  Service: General;  Laterality: Left;   PILONIDAL CYST / SINUS EXCISION     RIGHT/LEFT HEART CATH AND CORONARY ANGIOGRAPHY N/A 11/18/2021   Procedure: RIGHT/LEFT HEART CATH AND CORONARY ANGIOGRAPHY;  Surgeon: Corey Skains, MD;  Location: [facility];  Service: Cardiovascular;  Laterality: N/A;   TEE WITHOUT CARDIOVERSION N/A 10/14/2021   Procedure: TRANSESOPHAGEAL ECHOCARDIOGRAM (TEE);  Surgeon: Corey Skains, MD;  Location: [facility];  Service: Cardiovascular;  Laterality: N/A;   TOE SURGERY      OB History   No obstetric history on file.      Home Medications    Prior to Admission medications   Medication Sig Start Date End Date Taking? Authorizing Provider  nirmatrelvir/ritonavir EUA, renal dosing, (PAXLOVID) 10 x 150 MG & 10 x 100MG TABS Take 2 tablets by mouth 2 (two) times daily for 5 days. Patient GFR is 53. Take nirmatrelvir (150 mg) one tablet twice daily for 5 days and ritonavir (100 mg) one tablet twice daily for 5 days. 05/13/22 05/18/22 Yes Marney Setting, NP  predniSONE (STERAPRED UNI-PAK 21 TAB) 10 MG (21) TBPK tablet Take by mouth daily. Take 6 tabs by mouth daily  for 2 days, then 5 tabs for 2 days, then 4 tabs for 2 days, then 3 tabs for 2 days, 2 tabs for 2 days, then 1 tab  by mouth daily for 2 days 05/13/22  Yes Marney Setting, NP  acetaminophen (TYLENOL) 325 MG tablet Take by mouth.    [provider]  amiodarone (PACERONE) 200 MG tablet Take 200 mg by mouth daily. 12/05/21   [provider]  amoxicillin (AMOXIL) 500 MG capsule SMARTSIG:4 Capsule(s) By Mouth 03/08/22   [provider]  aspirin 81 MG chewable tablet Chew by mouth. 12/06/21 12/06/22  [provider]  Biotin w/ Vitamins C & E (HAIR/SKIN/NAILS PO) Take 1 tablet by mouth in the morning.    [provider]  Calcium Carb-Cholecalciferol (CALCIUM 500+D3 PO) Take 1 tablet by mouth in the morning.    [provider]  Cholecalciferol (VITAMIN D3 PO) Take 1 tablet by mouth in the morning.    [provider]  ferrous sulfate 324 (65 Fe) MG TBEC Take 1 tablet by mouth every morning. 12/05/21   [provider]  Multiple Minerals-Vitamins (CAL-MAG-ZINC-D PO) Take 1 tablet by mouth in the morning. Patient not taking: Reported on 11/18/2021    [provider]  traZODone (DESYREL) 50 MG tablet Take 25 mg by mouth at bedtime. 12/05/21   [provider]  triamterene-hydrochlorothiazide (DYAZIDE) 37.5-25 MG capsule Take 1 capsule by mouth in the morning. 05/25/16 05/03/37  [provider]    Family History Family History  Problem Relation Age of Onset   Cancer Mother 72       Liver   Breast cancer Neg Hx     Social History Social History   Tobacco Use   Smoking status: Former   Smokeless tobacco: Never  Scientific laboratory technician Use: Never used  Substance Use Topics   Alcohol use: Yes    Alcohol/week: 2.0 standard drinks    Types: 2 Shots of liquor per week   Drug use: No     Allergies   Patient has no known allergies.   Review of Systems Review of Systems  Constitutional:  Positive for fatigue. Negative for fever.  HENT:  Positive for congestion. Negative for sinus pain.   Respiratory:  Positive for cough. Negative for shortness of breath.   Cardiovascular: Negative.   Gastrointestinal: Negative.   Genitourinary: Negative.   Neurological: Negative.     Physical Exam Triage Vital Signs ED Triage Vitals  Enc Vitals Group     BP 05/13/22 1038 104/61     Pulse Rate 05/13/22 1038 85     Resp 05/13/22 1038 18     Temp 05/13/22 1038 98.5 F (36.9 C)     Temp Source 05/13/22 1038 Oral     SpO2 05/13/22 1038 100 %     Weight 05/13/22 1036 185 lb (83.9 kg)     Height 05/13/22 1036 _0  (1.753 m)     Head Circumference --      Peak Flow --      Pain Score 05/13/22 1034 0     Pain Loc --      Pain Edu? --      Excl. in [place]? --    No  data found.  Updated Vital Signs BP 104/61 (BP Location: Left Arm)   Pulse 85   Temp 98.5 F (36.9 C) (Oral)   Resp 18   Ht _1  (1.753 m)   Wt 185 lb (83.9 kg)   SpO2 100%   BMI 27.32 kg/m   Visual Acuity Right Eye Distance:   Left Eye Distance:   Bilateral Distance:  Right Eye Near:   Left Eye Near:    Bilateral Near:     Physical Exam Constitutional:      Appearance: Normal appearance.  HENT:     Right Ear: Tympanic membrane normal.     Left Ear: Tympanic membrane normal.     Nose: Congestion present.  Eyes:     Pupils: Pupils are equal, round, and reactive to light.  Cardiovascular:     Heart sounds: Normal heart sounds.  Pulmonary:     Effort: Pulmonary effort is normal.     Breath sounds: Normal breath sounds.  Abdominal:     General: Abdomen is flat.  Musculoskeletal:        General: Normal range of motion.  Skin:    General: Skin is warm.     Capillary Refill: Capillary refill takes less than 2 seconds.  Neurological:     General: No focal deficit present.     Mental Status: She is alert.     UC Treatments / Results  Labs (all labs ordered are listed, but only abnormal results are displayed) Labs Reviewed - No data to display  EKG   Radiology No results found.  Procedures Procedures (including critical care time)  Medications Ordered in UC Medications - No data to display  Initial Impression / Assessment and Plan / UC Course  I have reviewed the triage vital signs and the nursing notes.  Pertinent labs & imaging results that were available during my care of the patient were reviewed by me and considered in my medical decision making (see chart for details).     Continue to stay hydrated push plenty of clear fluids Keep your appointment with cardiology If symptoms come worse or you have increased shortness of breath you will need to be seen in the emergency room Patient states that she does not take any medications daily at all  and that her cardiologist is aware of this. Reviewed patient's labs her GFR is 53 Final Clinical Impressions(s) / UC Diagnoses   Final diagnoses:  Acute cough  COVID-19     Discharge Instructions      Continue to stay hydrated push plenty of clear fluids Keep your appointment with cardiology If symptoms come worse or you have increased shortness of breath you will need to be seen in the emergency room      ED Prescriptions     Medication Sig Dispense Auth. Provider   predniSONE (STERAPRED UNI-PAK 21 TAB) 10 MG (21) TBPK tablet Take by mouth daily. Take 6 tabs by mouth daily  for 2 days, then 5 tabs for 2 days, then 4 tabs for 2 days, then 3 tabs for 2 days, 2 tabs for 2 days, then 1 tab by mouth daily for 2 days 42 tablet Morley Kos L, NP   nirmatrelvir/ritonavir EUA, renal dosing, (PAXLOVID) 10 x 150 MG & 10 x 100MG TABS Take 2 tablets by mouth 2 (two) times daily for 5 days. Patient GFR is 53. Take nirmatrelvir (150 mg) one tablet twice daily for 5 days and ritonavir (100 mg) one tablet twice daily for 5 days. 20 tablet Marney Setting, NP      PDMP not reviewed this encounter.   Marney Setting, NP 05/13/22 [phone]

## 2022-05-13 NOTE — Discharge Instructions (Addendum)
Continue to stay hydrated push plenty of clear fluids Keep your appointment with cardiology If symptoms come worse or you have increased shortness of breath you will need to be seen in the emergency room

## 2022-05-13 NOTE — ED Triage Notes (Signed)
Patient is here for COVID19+ "tested positive yesterday". Now experiencing "very tired, runny nose, decrease po's, no sob, high risk due to heart history". Needing Rx.

## 2022-06-28 ENCOUNTER — Other Ambulatory Visit: Payer: Self-pay | Admitting: Family Medicine

## 2022-06-28 DIAGNOSIS — Z1231 Encounter for screening mammogram for malignant neoplasm of breast: Secondary | ICD-10-CM

## 2022-07-29 ENCOUNTER — Inpatient Hospital Stay: Admission: RE | Admit: 2022-07-29 | Payer: 59 | Source: Ambulatory Visit

## 2022-08-24 ENCOUNTER — Ambulatory Visit
Admission: RE | Admit: 2022-08-24 | Discharge: 2022-08-24 | Disposition: A | Discharge status: Home / Self Care | Payer: Medicare Other | Source: Ambulatory Visit | Attending: Family Medicine | Admitting: Family Medicine

## 2022-08-24 DIAGNOSIS — Z1231 Encounter for screening mammogram for malignant neoplasm of breast: Secondary | ICD-10-CM | POA: Insufficient documentation

## 2022-10-10 ENCOUNTER — Ambulatory Visit (INDEPENDENT_AMBULATORY_CARE_PROVIDER_SITE_OTHER): Payer: Medicare Other

## 2022-10-10 ENCOUNTER — Ambulatory Visit
Admission: EM | Admit: 2022-10-10 | Discharge: 2022-10-10 | Disposition: A | Discharge status: Home / Self Care | Payer: Medicare Other | Attending: Emergency Medicine | Admitting: Emergency Medicine

## 2022-10-10 DIAGNOSIS — M25521 Pain in right elbow: Secondary | ICD-10-CM | POA: Diagnosis not present

## 2022-10-10 DIAGNOSIS — S52021A Displaced fracture of olecranon process without intraarticular extension of right ulna, initial encounter for closed fracture: Secondary | ICD-10-CM | POA: Diagnosis not present

## 2022-10-10 DIAGNOSIS — W19XXXA Unspecified fall, initial encounter: Secondary | ICD-10-CM | POA: Diagnosis not present

## 2022-10-10 NOTE — Discharge Instructions (Addendum)
I have spoken with the on-call orthopedist who is recommending that you be evaluated at the ER at [facility] as your fracture is beyond their ability to manage at [facility].  Do not have anything to eat or drink until after you have been evaluated by the on-call orthopedist at [facility].  Please go to the ER at [facility] in [place].  Please go now.

## 2022-10-10 NOTE — ED Notes (Signed)
Patient is being discharged from the Urgent Care and sent to the [facility] Emergency Department via private vehicle with family memeber . Per Margarette Canada, patient is in need of higher level of care due to needing Orthopedic recommendation for further treatment. Patient is aware and verbalizes understanding of plan of care.  Vitals:   10/10/22 0920  BP: 112/83  Pulse: 78  Temp: 97.7 F (36.5 C)  SpO2: 100%

## 2022-10-10 NOTE — ED Triage Notes (Signed)
Pt states she had a fall yesterday, pt states she was wearing socks & slipped on hardwood floor, pt states she landed on RT elbow, pt does report she did hit back of her head, denies any LOC

## 2022-10-10 NOTE — ED Provider Notes (Addendum)
[facility]    CSN: [id number] Arrival date & time: 10/10/22  7322      History   Chief Complaint Chief Complaint  Patient presents with   Elbow Injury    RT elbow    HPI Melanie Simmons is a 81 y.o. female.   HPI  81 year old female here for evaluation of right elbow pain.  Patient reports that she was walking on a hardwood floor in her socks last night when she slipped and landed on her right elbow.  She states she then also monitor striking the back of her head but did not have a loss of consciousness.  She has significant swelling to the proximal aspect of her right forearm with bruising.  She is unable to fully flex or extend her right arm.  She denies any numbness or tingling in her fingers.  She has full range of motion of her wrist, hand, and fingers.  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, estrogen receptor positive ([place]) 06/18/2016   Hyperlipidemia 04/01/2016   Episodic recurrent vertigo 04/30/2015   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    Past Surgical History:  Procedure Laterality Date   ABDOMINAL HYSTERECTOMY     BREAST BIOPSY Left 06/09/2016   left [place] core bx, [facility]   BREAST CYST ASPIRATION Left    neg   BROW LIFT Bilateral 09/06/2017   Procedure: BLEPHAROPLASTY;  Surgeon: Karle Starch, MD;  Location: [place];  Service: Ophthalmology;  Laterality: Bilateral;  MAC   MASTECTOMY Left  07/17/2016   [place], negative LN   MASTECTOMY W/ SENTINEL NODE BIOPSY Left 07/16/2016   Procedure: MASTECTOMY WITH SENTINEL LYMPH NODE BIOPSY;  Surgeon: Hubbard Robinson, MD;  Location: [facility];  Service: General;  Laterality: Left;   PILONIDAL CYST / SINUS EXCISION     RIGHT/LEFT HEART CATH AND CORONARY ANGIOGRAPHY N/A 11/18/2021   Procedure: RIGHT/LEFT HEART CATH AND CORONARY ANGIOGRAPHY;  Surgeon: Corey Skains, MD;  Location: [facility];  Service: Cardiovascular;  Laterality: N/A;   TEE WITHOUT CARDIOVERSION N/A 10/14/2021   Procedure: TRANSESOPHAGEAL ECHOCARDIOGRAM (TEE);  Surgeon: Corey Skains, MD;  Location: [facility];  Service: Cardiovascular;  Laterality: N/A;   TOE SURGERY      OB History   No obstetric history on file.      Home Medications    Prior to Admission medications   Medication Sig Start Date End Date Taking? Authorizing Provider  acetaminophen (TYLENOL) 325 MG tablet Take by mouth.   Yes [provider]  amiodarone (PACERONE) 200 MG tablet Take 200 mg by mouth daily. 12/05/21   [provider]  amoxicillin (AMOXIL) 500 MG capsule SMARTSIG:4 Capsule(s) By Mouth 03/08/22   [provider]  aspirin 81 MG chewable tablet Chew by mouth. 12/06/21 12/06/22  [provider]  Biotin w/ Vitamins C & E (HAIR/SKIN/NAILS PO) Take 1 tablet  by mouth in the morning.    [provider]  Calcium Carb-Cholecalciferol (CALCIUM 500+D3 PO) Take 1 tablet by mouth in the morning.    [provider]  Cholecalciferol (VITAMIN D3 PO) Take 1 tablet by mouth in the morning.    [provider]  ferrous sulfate 324 (65 Fe) MG TBEC Take 1 tablet by mouth every morning. 12/05/21   [provider]  Multiple Minerals-Vitamins (CAL-MAG-ZINC-D PO) Take 1 tablet by mouth in the morning. Patient not taking: Reported on 11/18/2021    [provider]  predniSONE (STERAPRED UNI-PAK 21 TAB) 10 MG (21) TBPK  tablet Take by mouth daily. Take 6 tabs by mouth daily  for 2 days, then 5 tabs for 2 days, then 4 tabs for 2 days, then 3 tabs for 2 days, 2 tabs for 2 days, then 1 tab by mouth daily for 2 days 05/13/22   Marney Setting, NP  traZODone (DESYREL) 50 MG tablet Take 25 mg by mouth at bedtime. 12/05/21   [provider]  triamterene-hydrochlorothiazide (DYAZIDE) 37.5-25 MG capsule Take 1 capsule by mouth in the morning. 05/25/16 05/03/37  [provider]    Family History Family History  Problem Relation Age of Onset   Cancer Mother 64       Liver   Breast cancer Neg Hx     Social History Social History   Tobacco Use   Smoking status: Former   Smokeless tobacco: Never  Scientific laboratory technician Use: Never used  Substance Use Topics   Alcohol use: Yes    Alcohol/week: 2.0 standard drinks of alcohol    Types: 2 Shots of liquor per week   Drug use: No     Allergies   Patient has no known allergies.   Review of Systems Review of Systems  Constitutional:  Negative for fever.  Musculoskeletal:  Positive for arthralgias and joint swelling.  Skin:  Positive for color change.  Neurological:  Negative for weakness and numbness.  Hematological: Negative.   Psychiatric/Behavioral: Negative.       Physical Exam Triage Vital Signs ED Triage Vitals  Enc Vitals Group     BP 10/10/22 0920 112/83     Pulse Rate 10/10/22 0920 78     Resp --      Temp 10/10/22 0920 97.7 F (36.5 C)     Temp Source 10/10/22 0920 Oral     SpO2 10/10/22 0920 100 %     Weight 10/10/22 0919 190 lb (86.2 kg)     Height 10/10/22 0919 _0  (1.753 m)     Head Circumference --      Peak Flow --      Pain Score 10/10/22 0919 8     Pain Loc --      Pain Edu? --      Excl. in [place]? --    No data found.  Updated Vital Signs BP 112/83 (BP Location: Left Arm)   Pulse 78   Temp 97.7 F (36.5 C) (Oral)   Ht _1  (1.753 m)   Wt 190 lb (86.2 kg)   SpO2 100%   BMI 28.06 kg/m   Visual  Acuity Right Eye Distance:   Left Eye Distance:   Bilateral Distance:    Right Eye Near:   Left Eye Near:    Bilateral Near:     Physical Exam Vitals and nursing note reviewed.  Constitutional:      Appearance: Normal appearance. She  is not ill-appearing.  HENT:     Head: Normocephalic and atraumatic.  Musculoskeletal:        General: Swelling, tenderness, deformity and signs of injury present.  Skin:    General: Skin is warm and dry.     Capillary Refill: Capillary refill takes less than 2 seconds.     Findings: Bruising present.  Neurological:     General: No focal deficit present.     Mental Status: She is alert and oriented to person, place, and time.     Sensory: No sensory deficit.     Motor: No weakness.  Psychiatric:        Mood and Affect: Mood normal.        Behavior: Behavior normal.        Thought Content: Thought content normal.        Judgment: Judgment normal.      UC Treatments / Results  Labs (all labs ordered are listed, but only abnormal results are displayed) Labs Reviewed - No data to display  EKG   Radiology DG Elbow Complete Right  Result Date: 10/10/2022 CLINICAL DATA:  Fall, RIGHT elbow injury. EXAM: RIGHT ELBOW - COMPLETE 3+ VIEW COMPARISON:  None Available. FINDINGS: Significantly displaced, and likely comminuted, fracture of the olecranon. Underlying osteopenia. Proximal radius appears intact and normally aligned. Distal humerus appears intact and normally aligned. Soft tissue swelling posteriorly. Probable joint effusion. IMPRESSION: 1. Significantly displaced, and likely comminuted, fracture of the olecranon. 2. Underlying osteopenia. Electronically Signed   By: Franki Cabot M.D.   On: 10/10/2022 09:51    Procedures Procedures (including critical care time)  Medications Ordered in UC Medications - No data to display  Initial Impression / Assessment and Plan / UC Course  I have reviewed the triage vital signs and the nursing  notes.  Pertinent labs & imaging results that were available during my care of the patient were reviewed by me and considered in my medical decision making (see chart for details).   Patient is a pleasant, nontoxic-appearing 58-year-old female here for evaluation of pain in her right elbow after suffering a ground-level fall on hardwood floor last night and landing on her right elbow.    Patient has significant swelling and ecchymosis to her proximal forearm and distal upper arm as outlined in pictures above.  Her fingers are warm and dry with less than 2-second capillary refill.  Her radial ulnar pulses are 2+.  She is able to passively flex her right forearm to 90 degrees but cannot get it past 90 degrees.  She is also unable to fully extend her right forearm.  Patient does not have any tenderness with palpation of the proximal ulna, olecranon process, or medial lateral condyle of her humerus.  She does have some mild tenderness with palpation of the distal humerus.  Supination pronation of her forearm is not affected and does not cause pain.  I will obtain radiograph of her right elbow.  Right elbow radiographs independent reviewed and evaluated by me.  Impression: There is a comminuted displaced fracture of the olecranon process.  No other bony abnormalities noted.  Radiology overread is pending. Radiology impression states there is significant displacement and likely comminuted fracture of the olecranon.  Underlying osteopenia.  Patient is under the care of of [facility] primary care and [facility] cardiology.  Dr. Roland Rack is on-call for [facility] orthopedics so I have sent him a message through secure epic chat to discuss management of the patient.  I will place her in a posterior splint at 90 degrees of flexion and a sling.  Given her significant displacement my concern is that she needs emergent surgical intervention.  Dr. Roland Rack is not on for unassigned call so I have contacted Dr. Earnestine Leys with [facility] via secure epic chat.  Dr. Sabra Heck is suggesting that patient go to [facility] ER as he thinks that the fracture may be too complicated to manage at [facility].  Patient has elected to be evaluated in the ER at [facility] rather than [facility] as she sees [facility] primary care and cardiology.     Final Clinical Impressions(s) / UC Diagnoses   Final diagnoses:  Closed fracture of olecranon process of right ulna, initial encounter     Discharge Instructions      I have spoken with the on-call orthopedist who is recommending that you be evaluated at the ER at [facility] as your fracture is beyond their ability to manage at [facility].  Do not have anything to eat or drink until after you have been evaluated by the on-call orthopedist at [facility].  Please go to the ER at [facility] in [place].  Please go now.     ED Prescriptions   None    PDMP not reviewed this encounter.   Margarette Canada, NP 10/10/22 1018    Margarette Canada, NP 10/10/22 1024

## 2023-06-17 ENCOUNTER — Encounter: Payer: Self-pay | Admitting: Emergency Medicine

## 2023-06-17 ENCOUNTER — Other Ambulatory Visit: Payer: Self-pay

## 2023-06-17 ENCOUNTER — Ambulatory Visit
Admission: EM | Admit: 2023-06-17 | Discharge: 2023-06-17 | Disposition: A | Discharge status: Home / Self Care | Payer: Medicare Other | Attending: Emergency Medicine | Admitting: Emergency Medicine

## 2023-06-17 DIAGNOSIS — H6122 Impacted cerumen, left ear: Secondary | ICD-10-CM | POA: Diagnosis not present

## 2023-06-17 DIAGNOSIS — H6993 Unspecified Eustachian tube disorder, bilateral: Secondary | ICD-10-CM | POA: Diagnosis not present

## 2023-06-17 MED ORDER — FLUTICASONE PROPIONATE 50 MCG/ACT NA SUSP: 2.0000 | Freq: Every day | NASAL | 1 refills | Status: AC

## 2023-06-17 MED ORDER — IPRATROPIUM BROMIDE 0.06 % NA SOLN: 2.0000 | Freq: Four times a day (QID) | NASAL | 12 refills | Status: AC

## 2023-06-17 NOTE — Discharge Instructions (Addendum)
Use the Atrovent nasal spray, 2 squirts in each nostril every 6 hours, to help with nasal congestion.  Take over-the-counter Zyrtec, Claritin, or Allegra once daily to help with allergic symptoms.  Instill 2 squirts of fluticasone in each nostril at bedtime nightly.  And the nasal away from the septum of your nose and follow each set of squirts with 1 squirt of nasal saline to push the particles up into your turbinates where they will take effect.  Continue to equalize your ears as shown to help clear mucus from eustachian tubes and maintain patency.   

## 2023-06-17 NOTE — ED Provider Notes (Signed)
[facility]    CSN: [id number] Arrival date & time: 06/17/23  1559      History   Chief Complaint Chief Complaint  Patient presents with   Ear Fullness   Dizziness    HPI Melanie Simmons is a 82 y.o. female.   HPI  82 year old female with a past medical history significant for essential hypertension, MVP, hyperlipidemia, episodic recurrent vertigo, and breast cancer presents for evaluation of intermittent hearing loss in her left ear that is been going on for the past month.  Over the past week it has become more constant but she is not experience any pain or drainage.  She reports that she would be able to pop her ear and her symptoms were resolved but it has not popped over the last several days.  Yesterday she started to experience dizziness and she continues to have mild dizziness today.  No vomiting.  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, estrogen receptor positive (HCC) 06/18/2016   Hyperlipidemia 04/01/2016   Episodic recurrent vertigo 04/30/2015   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    Past Surgical History:  Procedure Laterality Date   ABDOMINAL HYSTERECTOMY     BREAST BIOPSY Left 06/09/2016   left [place] core bx, [facility]   BREAST CYST ASPIRATION Left    neg   BROW LIFT Bilateral 09/06/2017   Procedure: BLEPHAROPLASTY;  Surgeon: Imagene Riches, MD;  Location: [facility];  Service: Ophthalmology;  Laterality:  Bilateral;  MAC   MASTECTOMY Left 07/17/2016   [facility], negative LN   MASTECTOMY W/ SENTINEL NODE BIOPSY Left 07/16/2016   Procedure: MASTECTOMY WITH SENTINEL LYMPH NODE BIOPSY;  Surgeon: Gladis Riffle, MD;  Location: [facility] OR[facility];  Service: General;  Laterality: Left;   PILONIDAL CYST / SINUS EXCISION     RIGHT/LEFT HEART CATH AND CORONARY ANGIOGRAPHY N/A 11/18/2021   Procedure: RIGHT/LEFT HEART CATH AND CORONARY ANGIOGRAPHY;  Surgeon: Lamar Blinks, MD;  Location: [facility];  Service: Cardiovascular;  Laterality: N/A;   TEE WITHOUT CARDIOVERSION N/A 10/14/2021   Procedure: TRANSESOPHAGEAL ECHOCARDIOGRAM (TEE);  Surgeon: Lamar Blinks, MD;  Location: [facility];  Service: Cardiovascular;  Laterality: N/A;   TOE SURGERY      OB History   No obstetric history on file.      Home Medications    Prior to Admission medications   Medication Sig Start Date End Date Taking? Authorizing Provider  fluticasone (FLONASE) 50 MCG/ACT nasal spray Place 2 sprays into both nostrils daily. 06/17/23  Yes Becky Augusta, NP  ipratropium (ATROVENT) 0.06 % nasal spray Place 2 sprays into both nostrils 4 (four) times daily. 06/17/23  Yes Becky Augusta, NP  acetaminophen (TYLENOL) 325 MG tablet Take by mouth.    [provider]    Family History Family History  Problem Relation Age of Onset   Cancer Mother 60  Liver   Breast cancer Neg Hx     Social History Social History   Tobacco Use   Smoking status: Former   Smokeless tobacco: Never  Building services engineer Use: Never used  Substance Use Topics   Alcohol use: Yes    Alcohol/week: 2.0 standard drinks of alcohol    Types: 2 Shots of liquor per week   Drug use: No     Allergies   Patient has no known allergies.   Review of Systems Review of Systems  HENT:  Positive for ear pain and hearing loss. Negative for ear discharge.   Neurological:  Positive for dizziness.     Physical Exam Triage Vital Signs ED  Triage Vitals  Enc Vitals Group     BP 06/17/23 1633 (!) 141/83     Pulse Rate 06/17/23 1633 64     Resp 06/17/23 1633 18     Temp 06/17/23 1633 98.2 F (36.8 C)     Temp Source 06/17/23 1633 Oral     SpO2 06/17/23 1633 100 %     Weight --      Height --      Head Circumference --      Peak Flow --      Pain Score 06/17/23 1631 0     Pain Loc --      Pain Edu? --      Excl. in GC? --    No data found.  Updated Vital Signs BP (!) 141/83 (BP Location: Left Arm)   Pulse 64   Temp 98.2 F (36.8 C) (Oral)   Resp 18   SpO2 100%   Visual Acuity Right Eye Distance:   Left Eye Distance:   Bilateral Distance:    Right Eye Near:   Left Eye Near:    Bilateral Near:     Physical Exam Vitals and nursing note reviewed.  Constitutional:      Appearance: Normal appearance. She is not ill-appearing.  HENT:     Head: Normocephalic and atraumatic.     Right Ear: Tympanic membrane, ear canal and external ear normal. There is no impacted cerumen.     Left Ear: External ear normal. There is impacted cerumen.  Skin:    General: Skin is warm and dry.     Capillary Refill: Capillary refill takes less than 2 seconds.  Neurological:     General: No focal deficit present.     Mental Status: She is alert and oriented to person, place, and time.      UC Treatments / Results  Labs (all labs ordered are listed, but only abnormal results are displayed) Labs Reviewed - No data to display  EKG   Radiology No results found.  Procedures Procedures (including critical care time)  Medications Ordered in UC Medications - No data to display  Initial Impression / Assessment and Plan / UC Course  I have reviewed the triage vital signs and the nursing notes.  Pertinent labs & imaging results that were available during my care of the patient were reviewed by me and considered in my medical decision making (see chart for details).   Patient is a nontoxic-appearing 14-year-old female  presenting for evaluation of 1 month worth of left ear complaints as outlined HPI above.  Her physical exam does reveal hard, dark, cerumen in the left external auditory canal.  Right EAC is clear and her tympanic membrane is pearly gray in appearance.  I will order lavage of  the patient's ear and reassess for presence of infection.  Given that patient has been experiencing intermittent dizziness I suspect the patient has an otitis media or serous effusion in her left ear.  Patient is left ear reassessed following ear lavage.  There was still some liquid and dried wax in place that was easily removed with a curette.  After the wax was removed I was able to visualize the patient's tympanic membrane which was pearly gray in appearance.  I did ask the patient to attempt to perform the Valsalva maneuver to see if she could clear her ears and she was unable to do so.  I do believe the patient also has some eustachian tube dysfunction that is playing a role in her symptomology.  I will discharge her home on Atrovent and Flonase nasal spray along with over-the-counter antihistamine such as Claritin, Zyrtec, or Allegra.  She is to continue to do the Valsalva maneuver at home periodically to try and clear her eustachian tubes to allow her middle ear to drain.  If her symptoms do not improve she can return for reevaluation.   Final Clinical Impressions(s) / UC Diagnoses   Final diagnoses:  Eustachian tube dysfunction, bilateral  Impacted cerumen of left ear     Discharge Instructions      Use the Atrovent nasal spray, 2 squirts in each nostril every 6 hours, to help with nasal congestion.  Take over-the-counter Zyrtec, Claritin, or Allegra once daily to help with allergic symptoms.  Instill 2 squirts of fluticasone in each nostril at bedtime nightly.  And the nasal away from the septum of your nose and follow each set of squirts with 1 squirt of nasal saline to push the particles up into your turbinates  where they will take effect.  Continue to equalize your ears as shown to help clear mucus from eustachian tubes and maintain patency.       ED Prescriptions     Medication Sig Dispense Auth. Provider   ipratropium (ATROVENT) 0.06 % nasal spray Place 2 sprays into both nostrils 4 (four) times daily. 15 mL Becky Augusta, NP   fluticasone [facility]) 50 MCG/ACT nasal spray Place 2 sprays into both nostrils daily. 18.2 mL Becky Augusta, NP      PDMP not reviewed this encounter.   Becky Augusta, NP 06/17/23 [phone]

## 2023-06-17 NOTE — ED Triage Notes (Signed)
Patient presents to [facility] for on and off loss of hearing in her left ear x 1 month, but the past week it has become more constant.  No pain, no drainage.  C/o some dizziness with movement, but had a longer episode yesterday that lasted for a while so she couldn't play golf.  Dizzy at this time, but better than it was.  Was able to drive.

## 2023-08-02 ENCOUNTER — Other Ambulatory Visit: Payer: Self-pay | Admitting: Family Medicine

## 2023-08-02 DIAGNOSIS — Z1231 Encounter for screening mammogram for malignant neoplasm of breast: Secondary | ICD-10-CM

## 2023-08-30 ENCOUNTER — Ambulatory Visit
Admission: RE | Admit: 2023-08-30 | Discharge: 2023-08-30 | Disposition: A | Discharge status: Home / Self Care | Payer: Medicare Other | Source: Ambulatory Visit | Attending: Family Medicine | Admitting: Family Medicine

## 2023-08-30 DIAGNOSIS — Z1231 Encounter for screening mammogram for malignant neoplasm of breast: Secondary | ICD-10-CM | POA: Diagnosis present

## 2024-02-22 ENCOUNTER — Other Ambulatory Visit: Payer: Self-pay | Admitting: Family Medicine

## 2024-02-22 DIAGNOSIS — Z780 Asymptomatic menopausal state: Secondary | ICD-10-CM

## 2024-02-22 DIAGNOSIS — M85832 Other specified disorders of bone density and structure, left forearm: Secondary | ICD-10-CM

## 2024-08-17 ENCOUNTER — Other Ambulatory Visit: Payer: Self-pay | Admitting: Family Medicine

## 2024-08-17 DIAGNOSIS — Z1231 Encounter for screening mammogram for malignant neoplasm of breast: Secondary | ICD-10-CM

## 2024-09-02 ENCOUNTER — Ambulatory Visit
Admission: EM | Admit: 2024-09-02 | Discharge: 2024-09-02 | Disposition: A | Discharge status: Home / Self Care | Attending: Emergency Medicine | Admitting: Emergency Medicine

## 2024-09-02 ENCOUNTER — Encounter: Payer: Self-pay | Admitting: Emergency Medicine

## 2024-09-02 DIAGNOSIS — L259 Unspecified contact dermatitis, unspecified cause: Secondary | ICD-10-CM

## 2024-09-02 MED ORDER — TRIAMCINOLONE ACETONIDE 0.1 % EX CREA: 1.0000 | TOPICAL_CREAM | Freq: Two times a day (BID) | CUTANEOUS | 0 refills | Status: AC

## 2024-09-02 NOTE — ED Provider Notes (Signed)
 HPI  SUBJECTIVE:  Melanie Simmons is a 83 y.o. female who presents with an erythematous, flat rash on her bilateral lower extremities for the past 2.5 months after swimming in a pool.  It has not spread or changed since this started.  No blisters, pain, itching.  No rash elsewhere.  She states that she has a lot of exposure to the sun while she plays golf and thought this was sun poisoning at first.  No new lotions, soaps, detergents.  No leg swelling.  She put Lotrimin on it last week with improvement in the appearance, has transitioned to a scabby brown rash.  She has also been wearing pants to limit sun exposure without improvement in her symptoms.  However, she noticed another patch of erythema identical to this rash today.  No aggravating factors. She has a past medical history of breast cancer, hyperlipidemia, hypertension, status post mitral valve repair.  No history of eczema, vasculitis, PAD/PVD.  PCP: [facility].   Past Medical History:  Diagnosis Date   Breast cancer (HCC)    Hyperlipidemia    Hypertension    Motion sickness    ships   PONV (postoperative nausea and vomiting)    Vertigo     Past Surgical History:  Procedure Laterality Date   ABDOMINAL HYSTERECTOMY     BREAST BIOPSY Left 06/09/2016   left us  core bx, [facility]   BREAST CYST ASPIRATION Left    neg   BROW LIFT Bilateral 09/06/2017   Procedure: BLEPHAROPLASTY;  Surgeon: Ife Vitelli Greig HERO, MD;  Location: [facility];  Service: Ophthalmology;  Laterality: Bilateral;  MAC   MASTECTOMY Left 07/17/2016   [facility], negative LN   MASTECTOMY W/ SENTINEL NODE BIOPSY Left 07/16/2016   Procedure: MASTECTOMY WITH SENTINEL LYMPH NODE BIOPSY;  Surgeon: Dorothyann LITTIE Husk, MD;  Location: [facility] OR[facility];  Service: General;  Laterality: Left;   PILONIDAL CYST / SINUS EXCISION     RIGHT/LEFT HEART CATH AND CORONARY ANGIOGRAPHY N/A 11/18/2021   Procedure: RIGHT/LEFT HEART CATH AND CORONARY ANGIOGRAPHY;  Surgeon: Hester Wolm PARAS, MD;   Location: [facility];  Service: Cardiovascular;  Laterality: N/A;   TEE WITHOUT CARDIOVERSION N/A 10/14/2021   Procedure: TRANSESOPHAGEAL ECHOCARDIOGRAM (TEE);  Surgeon: Hester Wolm PARAS, MD;  Location: [facility];  Service: Cardiovascular;  Laterality: N/A;   TOE SURGERY      Family History  Problem Relation Age of Onset   Cancer Mother 23       Liver   Breast cancer Neg Hx     Social History   Tobacco Use   Smoking status: Former   Smokeless tobacco: Never  Vaping Use   Vaping status: Never Used  Substance Use Topics   Alcohol use: Yes    Alcohol/week: 2.0 standard drinks of alcohol    Types: 2 Shots of liquor per week   Drug use: No    No current facility-administered medications for this encounter.  Current Outpatient Medications:    triamcinolone  cream (KENALOG ) 0.1 %, Apply 1 Application topically 2 (two) times daily. Apply for 2 weeks. May use on face, Disp: 30 g, Rfl: 0   acetaminophen  (TYLENOL ) 325 MG tablet, Take by mouth., Disp: , Rfl:    fluticasone  (FLONASE ) 50 MCG/ACT nasal spray, Place 2 sprays into both nostrils daily., Disp: 18.2 mL, Rfl: 1   ipratropium (ATROVENT ) 0.06 % nasal spray, Place 2 sprays into both nostrils 4 (four) times daily., Disp: 15 mL, Rfl: 12  No Known Allergies  ROS  As noted in HPI.   Physical Exam  BP 133/81 (BP Location: Left Arm)   Pulse 65   Temp 98.2 F (36.8 C) (Oral)   Resp 14   Ht 5' 9 (1.753 m)   Wt 86.2 kg   SpO2 96%   BMI 28.06 kg/m   Constitutional: Well developed, well nourished, no acute distress Eyes:  EOMI, conjunctiva normal bilaterally HENT: Normocephalic, atraumatic,mucus membranes moist Respiratory: Normal inspiratory effort Cardiovascular: Normal rate GI: nondistended skin:  Nontender blanchable  flat area of erythema medial left lower extremity with no induration, increased temperature, blisters, weeping   Brown nontender scaly scabbed regions bilateral shins     Scaly, scabbed  nontender lesions behind both knees     Musculoskeletal: no deformities Neurologic: Alert & oriented x 3, no focal neuro deficits Psychiatric: Speech and behavior appropriate   ED Course   Medications - No data to display  No orders of the defined types were placed in this encounter.   No results found for this or any previous visit (from the past 24 hours). No results found.  ED Clinical Impression  1. Contact dermatitis, unspecified contact dermatitis type, unspecified trigger      ED Assessment/Plan     Could be a contact dermatitis.  Does not appear to be a vasculitis since it is blanchable, cellulitis as it is nontender, not hot.  Does not appear to be a fungal infection.  Eczema in the differential.  Will try triamcinolone  cream twice daily for 2 weeks, CeraVe moisturizing cream.  Follow-up with PCP as needed.   Discussed  MDM, treatment plan, and plan for follow-up with patient.. patient agrees with plan.   Meds ordered this encounter  Medications   triamcinolone  cream (KENALOG ) 0.1 %    Sig: Apply 1 Application topically 2 (two) times daily. Apply for 2 weeks. May use on face    Dispense:  30 g    Refill:  0      *This clinic note was created using Scientist, clinical (histocompatibility and immunogenetics). Therefore, there may be occasional mistakes despite careful proofreading.  ?    Van Knee, MD 09/03/24 [phone]

## 2024-09-02 NOTE — Discharge Instructions (Signed)
 Apply the triamcinolone  twice a day for 2 weeks or until the rash disappears.  Try CeraVe moisturizing cream for the dry skin.  Follow-up with your primary care provider if he gets worse.

## 2024-09-02 NOTE — ED Triage Notes (Signed)
 Patient states that she has a red non itchy rash on her lower legs for about 2 months.  Patient denies fevers.

## 2024-09-18 ENCOUNTER — Ambulatory Visit
Admission: RE | Admit: 2024-09-18 | Discharge: 2024-09-18 | Disposition: A | Discharge status: Home / Self Care | Source: Ambulatory Visit | Attending: Family Medicine | Admitting: Family Medicine

## 2024-09-18 DIAGNOSIS — Z1231 Encounter for screening mammogram for malignant neoplasm of breast: Secondary | ICD-10-CM | POA: Insufficient documentation

## 2024-09-26 ENCOUNTER — Encounter (INDEPENDENT_AMBULATORY_CARE_PROVIDER_SITE_OTHER): Payer: Self-pay | Admitting: Vascular Surgery

## 2024-09-26 ENCOUNTER — Ambulatory Visit (INDEPENDENT_AMBULATORY_CARE_PROVIDER_SITE_OTHER): Admitting: Vascular Surgery

## 2024-09-26 VITALS — BP 124/72 | HR 67 | Resp 18 | Ht 69.0 in | Wt 190.0 lb

## 2024-09-26 DIAGNOSIS — L242 Irritant contact dermatitis due to solvents: Secondary | ICD-10-CM | POA: Diagnosis not present

## 2024-09-26 DIAGNOSIS — R2243 Localized swelling, mass and lump, lower limb, bilateral: Secondary | ICD-10-CM

## 2024-09-26 MED ORDER — DOXYCYCLINE HYCLATE 100 MG PO CAPS: 100.0000 mg | ORAL_CAPSULE | Freq: Two times a day (BID) | ORAL | 0 refills | Status: AC

## 2024-09-27 ENCOUNTER — Encounter (INDEPENDENT_AMBULATORY_CARE_PROVIDER_SITE_OTHER): Payer: Self-pay | Admitting: Vascular Surgery

## 2024-09-27 ENCOUNTER — Other Ambulatory Visit (INDEPENDENT_AMBULATORY_CARE_PROVIDER_SITE_OTHER): Payer: Self-pay | Admitting: Vascular Surgery

## 2024-09-27 ENCOUNTER — Ambulatory Visit (INDEPENDENT_AMBULATORY_CARE_PROVIDER_SITE_OTHER)

## 2024-09-27 ENCOUNTER — Ambulatory Visit (INDEPENDENT_AMBULATORY_CARE_PROVIDER_SITE_OTHER): Admitting: Vascular Surgery

## 2024-09-27 ENCOUNTER — Encounter (INDEPENDENT_AMBULATORY_CARE_PROVIDER_SITE_OTHER)

## 2024-09-27 VITALS — BP 116/74 | HR 65 | Resp 18 | Wt 195.2 lb

## 2024-09-27 DIAGNOSIS — I872 Venous insufficiency (chronic) (peripheral): Secondary | ICD-10-CM

## 2024-09-27 DIAGNOSIS — L242 Irritant contact dermatitis due to solvents: Secondary | ICD-10-CM | POA: Diagnosis not present

## 2024-09-27 NOTE — Progress Notes (Signed)
 Subjective:    Patient ID: Melanie Simmons, female    DOB: 10-Jan-1941, 82 y.o.   MRN: [id number] Chief Complaint  Patient presents with   Wound Check    1 week follow up    Melanie Simmons is an 83 yo female who presents to clinic today in follow up for bilateral lower extremity contact dermatitis with a rash.  Patient presented to clinic yesterday with what appeared to be bilateral lower extremity contact dermatitis with a rash with possible left lower extremity cellulitis.  She was placed on doxycycline yesterday 100 mg twice daily and A and D ointment to be applied to her lower extremities twice daily.  She returns this morning for follow-up venous Doppler ultrasounds to rule out DVTs and reflux.  Patient endorses this morning after taking 2 doses of doxycycline her legs feel better and the A&E ointment improved the feeling to her legs and has helped reduce the dry flaky skin.    Review of Systems  Constitutional: Negative.   Cardiovascular:  Positive for leg swelling.  Skin:  Positive for rash.       Positive dry flaky skin with rash to bilateral lower extremities.  All other systems reviewed and are negative.      Objective:   Physical Exam Vitals reviewed.  Constitutional:      Appearance: Normal appearance. She is normal weight.  HENT:     Head: Normocephalic.  Eyes:     Pupils: Pupils are equal, round, and reactive to light.  Cardiovascular:     Rate and Rhythm: Normal rate and regular rhythm.     Pulses: Normal pulses.     Heart sounds: Normal heart sounds.  Pulmonary:     Effort: Pulmonary effort is normal.     Breath sounds: Normal breath sounds.  Abdominal:     General: Abdomen is flat. Bowel sounds are normal.     Palpations: Abdomen is soft.  Musculoskeletal:        General: Swelling and signs of injury present.     Comments: Scant amount of bilateral lower extremity swelling to her ankles.  Skin:    General: Skin is warm and dry.     Capillary Refill:  Capillary refill takes 2 to 3 seconds.     Findings: Rash present.     Comments: Very dry flaky skin to bilateral lower extremities with rash and erythema.  Neurological:     General: No focal deficit present.     Mental Status: She is alert and oriented to person, place, and time. Mental status is at baseline.  Psychiatric:        Mood and Affect: Mood normal.        Behavior: Behavior normal.        Thought Content: Thought content normal.        Judgment: Judgment normal.     BP 116/74   Pulse 65   Resp 18   Wt 195 lb 3.2 oz (88.5 kg)   BMI 28.83 kg/m   Past Medical History:  Diagnosis Date   Breast cancer (HCC)    Hyperlipidemia    Hypertension    Motion sickness    ships   PONV (postoperative nausea and vomiting)    Vertigo     Social History   Socioeconomic History   Marital status: Single    Spouse name: Not on file   Number of children: Not on file   Years of education: Not on file  Highest education level: Not on file  Occupational History   Occupation: Retired  Tobacco Use   Smoking status: Former   Smokeless tobacco: Never  Advertising account planner   Vaping status: Never Used  Substance and Sexual Activity   Alcohol use: Yes    Alcohol/week: 2.0 standard drinks of alcohol    Types: 2 Shots of liquor per week   Drug use: No   Sexual activity: Yes    Birth control/protection: Post-menopausal, Surgical  Other Topics Concern   Not on file  Social History Narrative   Not on file   Social Drivers of Health   Financial Resource Strain: Low Risk  (02/20/2024)   Received from [facility]   Overall Financial Resource Strain (CARDIA)    Difficulty of Paying Living Expenses: Not hard at all  Food Insecurity: No Food Insecurity (02/20/2024)   Received from [facility]   Hunger Vital Sign    Within the past 12 months, you worried that your food would run out before you got the money to buy more.: Never true    Within the past 12  months, the food you bought just didn't last and you didn't have money to get more.: Never true  Transportation Needs: No Transportation Needs (02/20/2024)   Received from [facility]    In the past 12 months, has lack of transportation kept you from medical appointments or from getting medications?: No    Lack of Transportation (Non-Medical): No  Physical Activity: Not on file  Stress: Not on file  Social Connections: Not on file  Intimate Partner Violence: Not on file    Past Surgical History:  Procedure Laterality Date   ABDOMINAL HYSTERECTOMY     BREAST BIOPSY Left 06/09/2016   left us  core bx, [facility]   BREAST CYST ASPIRATION Left    neg   BROW LIFT Bilateral 09/06/2017   Procedure: BLEPHAROPLASTY;  Surgeon: Ashley Greig HERO, MD;  Location: [facility];  Service: Ophthalmology;  Laterality: Bilateral;  MAC   MASTECTOMY Left 07/17/2016   [facility], negative LN   MASTECTOMY W/ SENTINEL NODE BIOPSY Left 07/16/2016   Procedure: MASTECTOMY WITH SENTINEL LYMPH NODE BIOPSY;  Surgeon: Dorothyann LITTIE Husk, MD;  Location: [facility];  Service: General;  Laterality: Left;   PILONIDAL CYST / SINUS EXCISION     RIGHT/LEFT HEART CATH AND CORONARY ANGIOGRAPHY N/A 11/18/2021   Procedure: RIGHT/LEFT HEART CATH AND CORONARY ANGIOGRAPHY;  Surgeon: Hester Wolm PARAS, MD;  Location: [facility];  Service: Cardiovascular;  Laterality: N/A;   TEE WITHOUT CARDIOVERSION N/A 10/14/2021   Procedure: TRANSESOPHAGEAL ECHOCARDIOGRAM (TEE);  Surgeon: Hester Wolm PARAS, MD;  Location: [facility];  Service: Cardiovascular;  Laterality: N/A;   TOE SURGERY      Family History  Problem Relation Age of Onset   Cancer Mother 16       Liver   Breast cancer Neg Hx     No Known Allergies     Latest Ref Rng & Units 07/07/2021    3:03 PM 08/03/2019    9:33 AM 11/15/2017    2:28 PM  CBC  WBC 4.0 - 10.5 K/uL 8.4  5.6  6.7   Hemoglobin 12.0 - 15.0 g/dL 86.3  86.1  85.7    Hematocrit 36.0 - 46.0 % 40.1  40.4  41.6   Platelets 150 - 400 K/uL 279  251  270       CMP  Component Value Date/Time   NA 137 07/07/2021 1503   NA 142 09/11/2012 2242   K 3.4 (L) 07/07/2021 1503   K 3.8 09/11/2012 2242   CL 101 07/07/2021 1503   CL 104 09/11/2012 2242   CO2 29 07/07/2021 1503   CO2 29 09/11/2012 2242   GLUCOSE 102 (H) 07/07/2021 1503   GLUCOSE 94 09/11/2012 2242   BUN 26 (H) 07/07/2021 1503   BUN 27 (H) 09/11/2012 2242   CREATININE 1.07 (H) 07/07/2021 1503   CREATININE 0.80 09/11/2012 2242   CALCIUM 9.5 07/07/2021 1503   CALCIUM 9.7 09/11/2012 2242   PROT 7.3 07/07/2021 1503   PROT 7.5 09/11/2012 2242   ALBUMIN 4.4 07/07/2021 1503   ALBUMIN 4.4 09/11/2012 2242   AST 17 07/07/2021 1503   AST 19 09/11/2012 2242   ALT 16 07/07/2021 1503   ALT 19 09/11/2012 2242   ALKPHOS 60 07/07/2021 1503   ALKPHOS 72 09/11/2012 2242   BILITOT 0.9 07/07/2021 1503   BILITOT 0.4 09/11/2012 2242   GFRNONAA 53 (L) 07/07/2021 1503   GFRNONAA >60 09/11/2012 2242     No results found.     Assessment & Plan:   1. 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  Patient's ultrasound this morning were positive for small amounts of reflux in her left lower extremity.  This would not create the rash and swelling to her bilateral lower extremities that she currently has.  Therefore I believe this is a contact dermatitis with possible infection that she may have gotten after getting sunburn and bathing in a community pool that may have been inundated with bacteria.  Patient was placed on doxycycline 100 mg twice daily yesterday.  She states she has taken 2 doses and her legs feel better this morning.  I also asked her to use A&E ointment OTC twice daily to her legs for the dryness and the flaking.   She says this works well and has made her legs feel better as well.  I feel like this is a skin irritation that may have occurred as a multitude of interactions back in June.  Unfortunately the patient did not seek medical care immediately so it is hard to tell if any of the swelling to her legs at that time may have caused some vascular insufficiency but today that seems to have resolved.  When she is left with his severe case of dermatitis with possible cellulitis which is being addressed with some antibiotics.  I discussed the findings of the ultrasounds with the patient and recommended that I would put in an urgent consult for her to be seen by dermatology as soon as possible.  Patient verbalized understanding and agrees with the plan.  Patient will follow-up with us  as needed after she sees dermatology if she is having any issues.   Current Outpatient Medications on File Prior to Visit  Medication Sig Dispense Refill   acetaminophen  (TYLENOL ) 325 MG tablet Take by mouth.     doxycycline (VIBRAMYCIN) 100 MG capsule Take 1 capsule (100 mg total) by mouth 2 (two) times daily for 10 days. 20 capsule 0   fluticasone  (FLONASE ) 50 MCG/ACT nasal spray Place 2 sprays into both nostrils daily. 18.2 mL 1   ipratropium (ATROVENT ) 0.06 % nasal spray Place 2 sprays into both nostrils 4 (four) times daily. 15 mL 12  triamcinolone  cream (KENALOG ) 0.1 % Apply 1 Application topically 2 (two) times daily. Apply for 2 weeks. May use on face 30 g 0   No current facility-administered medications on file prior to visit.    There are no Patient Instructions on file for this visit. No follow-ups on file.   Gwendlyn JONELLE Shank, NP

## 2024-09-27 NOTE — Progress Notes (Signed)
 Subjective:    Patient ID: Melanie Simmons, female    DOB: 1941/05/17, 83 y.o.   MRN: [id number] Chief Complaint  Patient presents with   Establish Care    New patient stasis dermatitis of both legs ref. [place]     Melanie Simmons is an 83 year old female who presents with a persistent rash to her bilateral lower extremity's .  She started to have this skin rash on her leg since the end of June, after getting what she feels was sun poisoning to her legs after a day at the pool.  She also was initially attributing it to sun exposure due to her frequent outdoor activities. Four weeks ago, she visited an urgent care where she was prescribed a steroid ointment Kenalog . Despite this treatment, the rash has not significantly improved, clearing only slightly before worsening again. Her skin has become dry and flaking. Denies any blisters but immediately after this started she developed swelling to he bilateral lower extremities. She presented to Dr Eliverto who feels this may be from venous stasis and vascular insufficiency.    The rash is described as non-itchy and not associated with leg swelling today. No itching or swelling of the legs is reported.      Review of Systems  Constitutional: Negative.   Cardiovascular:  Positive for leg swelling.  Skin:  Positive for color change and rash.  All other systems reviewed and are negative.      Objective:   Physical Exam Vitals reviewed.  Constitutional:      Appearance: Normal appearance. She is normal weight.  HENT:     Head: Normocephalic.  Eyes:     Pupils: Pupils are equal, round, and reactive to light.  Cardiovascular:     Rate and Rhythm: Normal rate and regular rhythm.     Pulses: Normal pulses.     Heart sounds: Normal heart sounds.  Pulmonary:     Effort: Pulmonary effort is normal.     Breath sounds: Normal breath sounds.  Abdominal:     General: Abdomen is flat. Bowel sounds are normal.     Palpations: Abdomen is soft.   Musculoskeletal:     Right lower leg: Edema present.     Left lower leg: Edema present.  Skin:    General: Skin is warm and dry.     Capillary Refill: Capillary refill takes 2 to 3 seconds.     Findings: Rash present.     Comments: Flaking Skin in specific areas of her lower calves. See the Media picture.   Neurological:     General: No focal deficit present.     Mental Status: She is alert and oriented to person, place, and time. Mental status is at baseline.  Psychiatric:        Mood and Affect: Mood normal.        Behavior: Behavior normal.        Thought Content: Thought content normal.        Judgment: Judgment normal.     BP 124/72 (BP Location: Left Arm, Patient Position: Sitting, Cuff Size: Normal)   Pulse 67   Resp 18   Ht 5' 9 (1.753 m)   Wt 190 lb (86.2 kg)   BMI 28.06 kg/m   Past Medical History:  Diagnosis Date   Breast cancer (HCC)    Hyperlipidemia    Hypertension    Motion sickness    ships   PONV (postoperative nausea and vomiting)  Vertigo     Social History   Socioeconomic History   Marital status: Single    Spouse name: Not on file   Number of children: Not on file   Years of education: Not on file   Highest education level: Not on file  Occupational History   Occupation: Retired  Tobacco Use   Smoking status: Former   Smokeless tobacco: Never  Vaping Use   Vaping status: Never Used  Substance and Sexual Activity   Alcohol use: Yes    Alcohol/week: 2.0 standard drinks of alcohol    Types: 2 Shots of liquor per week   Drug use: No   Sexual activity: Yes    Birth control/protection: Post-menopausal, Surgical  Other Topics Concern   Not on file  Social History Narrative   Not on file   Social Drivers of Health   Financial Resource Strain: Low Risk  (02/20/2024)   Received from [facility]   Overall Financial Resource Strain (CARDIA)    Difficulty of Paying Living Expenses: Not hard at all  Food Insecurity: No  Food Insecurity (02/20/2024)   Received from [facility]   Hunger Vital Sign    Within the past 12 months, you worried that your food would run out before you got the money to buy more.: Never true    Within the past 12 months, the food you bought just didn't last and you didn't have money to get more.: Never true  Transportation Needs: No Transportation Needs (02/20/2024)   Received from [facility] - Transportation    In the past 12 months, has lack of transportation kept you from medical appointments or from getting medications?: No    Lack of Transportation (Non-Medical): No  Physical Activity: Not on file  Stress: Not on file  Social Connections: Not on file  Intimate Partner Violence: Not on file    Past Surgical History:  Procedure Laterality Date   ABDOMINAL HYSTERECTOMY     BREAST BIOPSY Left 06/09/2016   left us  core bx, IMC   BREAST CYST ASPIRATION Left    neg   BROW LIFT Bilateral 09/06/2017   Procedure: BLEPHAROPLASTY;  Surgeon: Ashley Greig HERO, MD;  Location[facility];  Service: Ophthalmology;  Laterality: Bilateral;  MAC   MASTECTOMY Left 07/17/2016   IM[facility], negative LN   MASTECTOMY W/ SENTINEL NODE BIOPSY Left 07/16/2016   Procedure: MASTECTOMY WITH SENTINEL LYMPH NODE BIOPSY;  Surgeon: Dorothyann LITTIE Husk, MD;  Location: [facility];  Service: General;  Laterality: Left;   PILONIDAL CYST / SINUS EXCISION     RIGHT/LEFT HEART CATH AND CORONARY ANGIOGRAPHY N/A 11/18/2021   Procedure: RIGHT/LEFT HEART CATH AND CORONARY ANGIOGRAPHY;  Surgeon: Hester Wolm PARAS, MD;  Location: [facility];  Service: Cardiovascular;  Laterality: N/A;   TEE WITHOUT CARDIOVERSION N/A 10/14/2021   Procedure: TRANSESOPHAGEAL ECHOCARDIOGRAM (TEE);  Surgeon: Hester Wolm PARAS, MD;  Location: [facility];  Service: Cardiovascular;  Laterality: N/A;   TOE SURGERY      Family History  Problem Relation Age of Onset   Cancer Mother 67       Liver    Breast cancer Neg Hx     No Known Allergies     Latest Ref Rng & Units 07/07/2021    3:03 PM 08/03/2019    9:33 AM 11/15/2017    2:28 PM  CBC  WBC 4.0 - 10.5 K/uL 8.4  5.6  6.7  Hemoglobin 12.0 - 15.0 g/dL 86.3  86.1  85.7   Hematocrit 36.0 - 46.0 % 40.1  40.4  41.6   Platelets 150 - 400 K/uL 279  251  270       CMP     Component Value Date/Time   NA 137 07/07/2021 1503   NA 142 09/11/2012 2242   K 3.4 (L) 07/07/2021 1503   K 3.8 09/11/2012 2242   CL 101 07/07/2021 1503   CL 104 09/11/2012 2242   CO2 29 07/07/2021 1503   CO2 29 09/11/2012 2242   GLUCOSE 102 (H) 07/07/2021 1503   GLUCOSE 94 09/11/2012 2242   BUN 26 (H) 07/07/2021 1503   BUN 27 (H) 09/11/2012 2242   CREATININE 1.07 (H) 07/07/2021 1503   CREATININE 0.80 09/11/2012 2242   CALCIUM 9.5 07/07/2021 1503   CALCIUM 9.7 09/11/2012 2242   PROT 7.3 07/07/2021 1503   PROT 7.5 09/11/2012 2242   ALBUMIN 4.4 07/07/2021 1503   ALBUMIN 4.4 09/11/2012 2242   AST 17 07/07/2021 1503   AST 19 09/11/2012 2242   ALT 16 07/07/2021 1503   ALT 19 09/11/2012 2242   ALKPHOS 60 07/07/2021 1503   ALKPHOS 72 09/11/2012 2242   BILITOT 0.9 07/07/2021 1503   BILITOT 0.4 09/11/2012 2242   GFRNONAA 53 (L) 07/07/2021 1503   GFRNONAA >60 09/11/2012 2242     No results found.     Assessment & Plan:   1. 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 She has a scant amount of swelling to her ankles and the most distal part of the calves. He left calf is slightly warm and red compared to her right calf. At this time this does not appear to be a severe case of vascular insufficiency. The initial swelling after the dermatitis may have worsened the reaction to her legs. Patient claims the swelling was not that bad. Unfortunately she  did not seek medical attention immediately. She waited over 4 weeks before being sen by anyone.   However at this time I recommend an urgent referral to Dermatology for evaluation. Stop the kenalog  today as it does not appear to be helping. I placed the patient on Doxycycline 100 gm BID for suspected small area of cellulitis to her left anterior calf. I recommend OTC A&D Ointment to be applied BID. I ordered Venous doppler Ultrasounds to evaluate for DVT and reflux. Patient will see me after ultrasound is complete to discuss findings.   2. Localized swelling of both lower legs Patient presents today with very dry red flaking skin to anterior areas of her calves. She does have scant amounts of swelling to her ankles at the most distal point of the rash.   I recommend she wear compression socks to help decrease the ankle swelling if it is bothering her. She denies any pain or itching today. I also recommended she elevate them as much as possible. I recommend she stay out of the pool in which she was in prior to this rash to her shins and calves.    Current Outpatient Medications on File Prior to Visit  Medication Sig Dispense Refill   acetaminophen  (TYLENOL ) 325 MG tablet Take by mouth.     triamcinolone  cream (KENALOG ) 0.1 % Apply 1 Application topically 2 (two) times daily. Apply for 2  weeks. May use on face 30 g 0   fluticasone  (FLONASE ) 50 MCG/ACT nasal spray Place 2 sprays into both nostrils daily. 18.2 mL 1   ipratropium (ATROVENT ) 0.06 % nasal spray Place 2 sprays into both nostrils 4 (four) times daily. 15 mL 12   No current facility-administered medications on file prior to visit.    There are no Patient Instructions on file for this visit. No follow-ups on file.   Gwendlyn JONELLE Shank, NP

## 2024-10-03 ENCOUNTER — Ambulatory Visit (INDEPENDENT_AMBULATORY_CARE_PROVIDER_SITE_OTHER): Admitting: Vascular Surgery
